# Patient Record
Sex: Female | Born: 1990
Health system: Southern US, Community
[De-identification: ages and names within clinical notes are randomized; demographics above are authoritative.]

## PROBLEM LIST (undated history)

## (undated) DIAGNOSIS — J45909 Unspecified asthma, uncomplicated: Secondary | ICD-10-CM

## (undated) DIAGNOSIS — E781 Pure hyperglyceridemia: Secondary | ICD-10-CM

## (undated) DIAGNOSIS — G43909 Migraine, unspecified, not intractable, without status migrainosus: Secondary | ICD-10-CM

## (undated) DIAGNOSIS — E88819 Insulin resistance, unspecified: Secondary | ICD-10-CM

## (undated) DIAGNOSIS — R7989 Other specified abnormal findings of blood chemistry: Secondary | ICD-10-CM

## (undated) DIAGNOSIS — E8881 Metabolic syndrome: Secondary | ICD-10-CM

## (undated) DIAGNOSIS — F319 Bipolar disorder, unspecified: Secondary | ICD-10-CM

## (undated) DIAGNOSIS — N946 Dysmenorrhea, unspecified: Secondary | ICD-10-CM

## (undated) HISTORY — PX: TYMPANOMASTOIDECTOMY WITH RECONSTRUCTION: SHX5679

## (undated) HISTORY — DX: Pure hyperglyceridemia: E78.1

## (undated) HISTORY — DX: Other specified abnormal findings of blood chemistry: R79.89

## (undated) HISTORY — DX: Metabolic syndrome: E88.81

## (undated) HISTORY — PX: WISDOM TOOTH EXTRACTION: SHX21

## (undated) HISTORY — DX: Dysmenorrhea, unspecified: N94.6

## (undated) HISTORY — DX: Migraine, unspecified, not intractable, without status migrainosus: G43.909

## (undated) HISTORY — DX: Bipolar disorder, unspecified: F31.9

## (undated) HISTORY — DX: Insulin resistance, unspecified: E88.819

---
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
## 2010-02-01 ENCOUNTER — Inpatient Hospital Stay (HOSPITAL_COMMUNITY): Admission: AD | Admit: 2010-02-01 | Discharge: 2010-02-01 | Payer: Self-pay | Admitting: Obstetrics and Gynecology

## 2010-04-09 ENCOUNTER — Inpatient Hospital Stay (HOSPITAL_COMMUNITY): Admission: AD | Admit: 2010-04-09 | Discharge: 2010-04-09 | Payer: Self-pay | Admitting: Obstetrics and Gynecology

## 2010-05-17 ENCOUNTER — Inpatient Hospital Stay (HOSPITAL_COMMUNITY): Admission: AD | Admit: 2010-05-17 | Discharge: 2010-05-19 | Payer: Self-pay | Admitting: Obstetrics and Gynecology

## 2010-05-19 ENCOUNTER — Encounter: Payer: Self-pay | Admitting: Obstetrics and Gynecology

## 2010-06-26 ENCOUNTER — Inpatient Hospital Stay (HOSPITAL_COMMUNITY)
Admission: AD | Admit: 2010-06-26 | Discharge: 2010-06-26 | Payer: Self-pay | Source: Home / Self Care | Admitting: Obstetrics and Gynecology

## 2010-07-17 ENCOUNTER — Inpatient Hospital Stay (HOSPITAL_COMMUNITY)
Admission: AD | Admit: 2010-07-17 | Discharge: 2010-07-20 | Payer: Self-pay | Source: Home / Self Care | Attending: Obstetrics and Gynecology | Admitting: Obstetrics and Gynecology

## 2010-07-18 ENCOUNTER — Encounter (INDEPENDENT_AMBULATORY_CARE_PROVIDER_SITE_OTHER): Payer: Self-pay | Admitting: Obstetrics and Gynecology

## 2010-10-02 LAB — CBC
Platelets: 210 10*3/uL (ref 150–400)
Platelets: 243 10*3/uL (ref 150–400)
RDW: 13.5 % (ref 11.5–15.5)
RDW: 13.6 % (ref 11.5–15.5)
WBC: 10.5 10*3/uL (ref 4.0–10.5)
WBC: 14.4 10*3/uL — ABNORMAL HIGH (ref 4.0–10.5)

## 2010-10-02 LAB — MRSA PCR SCREENING: MRSA by PCR: NEGATIVE

## 2010-10-04 LAB — COMPREHENSIVE METABOLIC PANEL
AST: 14 U/L (ref 0–37)
Alkaline Phosphatase: 78 U/L (ref 39–117)
CO2: 24 mEq/L (ref 19–32)
Chloride: 104 mEq/L (ref 96–112)
Creatinine, Ser: 0.54 mg/dL (ref 0.4–1.2)
GFR calc Af Amer: >60 mL/min (ref >60)
GFR calc non Af Amer: >60 mL/min (ref >60)
Potassium: 3.4 mEq/L — ABNORMAL LOW (ref 3.5–5.1)
Total Bilirubin: 0.3 mg/dL (ref 0.3–1.2)

## 2010-10-04 LAB — MRSA PCR SCREENING: MRSA by PCR: NEGATIVE

## 2010-10-04 LAB — URINE CULTURE
Culture  Setup Time: 201110270214
Special Requests: NEGATIVE

## 2010-10-04 LAB — CBC
MCH: 31.3 pg (ref 26.0–34.0)
Platelets: 213 10*3/uL (ref 150–400)
RBC: 3.4 MIL/uL — ABNORMAL LOW (ref 3.87–5.11)
RDW: 13.1 % (ref 11.5–15.5)
WBC: 12.7 10*3/uL — ABNORMAL HIGH (ref 4.0–10.5)

## 2010-10-05 LAB — URINE MICROSCOPIC-ADD ON

## 2010-10-05 LAB — URINALYSIS, ROUTINE W REFLEX MICROSCOPIC
Glucose, UA: NEGATIVE mg/dL
Specific Gravity, Urine: 1.015 (ref 1.005–1.030)

## 2010-10-08 LAB — URINALYSIS, ROUTINE W REFLEX MICROSCOPIC
Bilirubin Urine: NEGATIVE
Ketones, ur: NEGATIVE mg/dL
Nitrite: NEGATIVE
Protein, ur: NEGATIVE mg/dL
Urobilinogen, UA: 0.2 mg/dL (ref 0.0–1.0)

## 2010-10-08 LAB — URINE CULTURE: Colony Count: >=100000

## 2010-10-08 LAB — WET PREP, GENITAL

## 2010-10-08 LAB — GC/CHLAMYDIA PROBE AMP, GENITAL
Chlamydia, DNA Probe: NEGATIVE
GC Probe Amp, Genital: NEGATIVE

## 2010-10-08 LAB — URINE MICROSCOPIC-ADD ON

## 2010-10-30 LAB — URINALYSIS, ROUTINE W REFLEX MICROSCOPIC
Bilirubin Urine: NEGATIVE
Hgb urine dipstick: NEGATIVE
Nitrite: NEGATIVE
Protein, ur: NEGATIVE mg/dL
Specific Gravity, Urine: 1.016 (ref 1.005–1.030)
Urobilinogen, UA: 0.2 mg/dL (ref 0.0–1.0)

## 2010-10-30 LAB — WET PREP, GENITAL: Trich, Wet Prep: NONE SEEN

## 2010-10-30 LAB — URINE MICROSCOPIC-ADD ON

## 2010-10-30 LAB — POCT PREGNANCY, URINE: Preg Test, Ur: NEGATIVE

## 2010-12-05 NOTE — Discharge Summary (Signed)
Michelle [name], Michelle [name]                ACCOUNT NO.:  [id number]   MEDICAL RECORD NO.:  [id number]          PATIENT TYPE:  INP   LOCATION:  6119                         FACILITY:  [facility]   PHYSICIAN:  Pediatric Teaching ServiceDATE OF BIRTH:  1990-10-12   DATE OF ADMISSION:  12/18/2006  DATE OF DISCHARGE:  12/20/2006                               DISCHARGE SUMMARY   REASON FOR HOSPITALIZATION:  A 20 year old with high-risk sexual  behavior and strong family history of kidney stones with acute-onset  left flank pain and fever.   SIGNIFICANT FINDINGS:  On physical exam, patient with significant left  CVA tenderness along with CMT and purulent vaginal discharge.  Febrile  to 103.  C-Met within normal limits.  CBC with a white blood cell count  of 11.3, hemoglobin 12.6 and platelets 274, amylase 34, lipase 21.  U  pregnancy negative.  Urinalysis that was an I&O cath showed trace  leukocyte esterase with 3-6 white blood cells and a culture that was  positive for proteus.  Wet prep with clue cells, but otherwise was  negative.  GC and Chlamydia PCR were negative.  Abdominal and pelvic CT  showed an atrophic right kidney with compensatory left kidney  hypertrophy and small amount of free pelvic fluid.   TREATMENT:  Ceftriaxone x2 doses IV along with doxycycline 100 mg IV  b.i.d. that was transitioned to p.o. without difficulty, also given  Flagyl 2 gm p.o. IV and morphine and Percocet for pain.  Smoking  cessation consult.  Zofran for nausea and vomiting.  Patient was much  improved on physical exam with only mild pain and no dysuria on day of  discharge.   FINAL DIAGNOSIS:  Left flank pain, likely secondary to a passed kidney  stone with pyelonephritis +/- pelvic inflammatory disease.   DISCHARGE MEDICATIONS:  1. Doxycycline 100 mg p.o. b.i.d. x7 days.  2. Suprax 400 mg p.o. daily x7 days.  3. Percocet one to two tablets p.o. q.6 hour p.r.n., dispensed 20.   Patient encouraged to stop all  alcohol, tobacco and drugs and to use  condoms with each sexual encounter.  We also recommended that she try a  different oral contraceptive pill given her smoking and her weight.   [facility] at 9[facility]00 a.m. and Pediatric Urology  on June 3 at 2:45 p.m.   DISCHARGE WEIGHT:  95 kilos.   DISCHARGE CONDITION:  Improved and good.   A copy of the handwritten discharge summary was faxed to both [facility]  Urology and [facility].     ______________________________  Lupita Raider, M.D.    ______________________________  Pediatric Teaching Service    KS/MEDQ  D:  12/20/2006  T:  12/20/2006  Job:  [id number]

## 2011-05-04 LAB — WET PREP, GENITAL: Yeast Wet Prep HPF POC: NONE SEEN

## 2011-05-04 LAB — URINALYSIS, ROUTINE W REFLEX MICROSCOPIC
Bilirubin Urine: NEGATIVE
Glucose, UA: NEGATIVE
Ketones, ur: 15 — AB
Protein, ur: NEGATIVE
pH: 5.5

## 2011-05-04 LAB — POCT PREGNANCY, URINE
Operator id: 28833
Preg Test, Ur: NEGATIVE

## 2011-05-04 LAB — URINE CULTURE: Colony Count: >=100000

## 2012-02-07 ENCOUNTER — Ambulatory Visit (INDEPENDENT_AMBULATORY_CARE_PROVIDER_SITE_OTHER): Payer: Medicaid Other | Admitting: Obstetrics and Gynecology

## 2012-02-07 ENCOUNTER — Encounter: Payer: Self-pay | Admitting: Obstetrics and Gynecology

## 2012-02-07 VITALS — BP 110/70 | Ht 63.0 in | Wt 213.0 lb

## 2012-02-07 DIAGNOSIS — N898 Other specified noninflammatory disorders of vagina: Secondary | ICD-10-CM

## 2012-02-07 DIAGNOSIS — N926 Irregular menstruation, unspecified: Secondary | ICD-10-CM

## 2012-02-07 DIAGNOSIS — Z0000 Encounter for general adult medical examination without abnormal findings: Secondary | ICD-10-CM

## 2012-02-07 DIAGNOSIS — Z139 Encounter for screening, unspecified: Secondary | ICD-10-CM

## 2012-02-07 DIAGNOSIS — Z113 Encounter for screening for infections with a predominantly sexual mode of transmission: Secondary | ICD-10-CM

## 2012-02-07 DIAGNOSIS — A599 Trichomoniasis, unspecified: Secondary | ICD-10-CM

## 2012-02-07 DIAGNOSIS — Z124 Encounter for screening for malignant neoplasm of cervix: Secondary | ICD-10-CM

## 2012-02-07 LAB — POCT WET PREP (WET MOUNT)
Bacteria Wet Prep HPF POC: NEGATIVE
Clue Cells Wet Prep Whiff POC: NEGATIVE
pH: 5

## 2012-02-07 LAB — CBC
MCH: 29.3 pg (ref 26.0–34.0)
MCV: 83.1 fL (ref 78.0–100.0)
Platelets: 337 10*3/uL (ref 150–400)
RDW: 13.7 % (ref 11.5–15.5)
WBC: 12.9 10*3/uL — ABNORMAL HIGH (ref 4.0–10.5)

## 2012-02-07 LAB — HIV ANTIBODY (ROUTINE TESTING W REFLEX): HIV: NONREACTIVE

## 2012-02-07 LAB — HEPATITIS B SURFACE ANTIGEN: Hepatitis B Surface Ag: NEGATIVE

## 2012-02-07 LAB — TSH: TSH: 3.864 u[IU]/mL (ref 0.350–4.500)

## 2012-02-07 LAB — RPR

## 2012-02-07 LAB — HEPATITIS C ANTIBODY: HCV Ab: NEGATIVE

## 2012-02-07 MED ORDER — METRONIDAZOLE 500 MG PO TABS: 500.0000 mg | ORAL_TABLET | Freq: Two times a day (BID) | ORAL | Status: AC

## 2012-02-07 NOTE — Progress Notes (Signed)
Contraception None Last pap 09/06/2011 WNL Last Mammo None Last Colonoscopy None Last Dexa Scan None Primary MD [facility] at [facility] None  Pt desires STD testing and pap test. C/o irregular cycle last couple of months  Filed Vitals:   02/07/12 1117  BP: 110/70   ROS: noncontributory  Physical Examination: General appearance - alert, well appearing, and in no distress Neck - supple, no significant adenopathy Chest - clear to auscultation, no wheezes, rales or rhonchi, symmetric air entry Heart - normal rate and regular rhythm Abdomen - soft, nontender, nondistended, no masses or organomegaly Breasts - breasts appear normal, no suspicious masses, no skin or nipple changes or axillary nodes Pelvic - normal external genitalia, vulva, vagina, cervix, uterus and adnexa Back exam - no CVAT Extremities - no edema, redness or tenderness in the calves or thighs  A/P Wet Prep - trich - flagyl Pap today STD w/u with consent Pt wants implanon If irregular cycle continues after of implanon - schedule u/s

## 2012-02-08 LAB — VITAMIN D 25 HYDROXY (VIT D DEFICIENCY, FRACTURES): Vit D, 25-Hydroxy: 30 ng/mL (ref 30–89)

## 2012-02-08 LAB — HSV 1 ANTIBODY, IGG: HSV 1 Glycoprotein G Ab, IgG: 0.99 IV — ABNORMAL HIGH

## 2012-02-08 LAB — HSV 2 ANTIBODY, IGG: HSV 2 Glycoprotein G Ab, IgG: <0.1 IV

## 2012-02-11 LAB — PAP IG, CT-NG, RFX HPV ASCU
Chlamydia Probe Amp: NEGATIVE
GC Probe Amp: NEGATIVE

## 2012-02-14 ENCOUNTER — Encounter: Payer: Self-pay | Admitting: Obstetrics and Gynecology

## 2012-03-06 ENCOUNTER — Encounter (INDEPENDENT_AMBULATORY_CARE_PROVIDER_SITE_OTHER): Payer: Medicaid Other | Admitting: Obstetrics and Gynecology

## 2012-03-06 ENCOUNTER — Encounter: Payer: Medicaid Other | Admitting: Obstetrics and Gynecology

## 2012-04-09 ENCOUNTER — Other Ambulatory Visit: Payer: Self-pay | Admitting: Dermatology

## 2012-06-05 NOTE — Progress Notes (Signed)
error 

## 2013-03-06 ENCOUNTER — Emergency Department (HOSPITAL_COMMUNITY)
Admission: EM | Admit: 2013-03-06 | Discharge: 2013-03-06 | Disposition: A | Discharge status: Home / Self Care | Payer: Medicaid Other | Attending: Emergency Medicine | Admitting: Emergency Medicine

## 2013-03-06 ENCOUNTER — Encounter (HOSPITAL_COMMUNITY): Payer: Self-pay | Admitting: Emergency Medicine

## 2013-03-06 DIAGNOSIS — Z8639 Personal history of other endocrine, nutritional and metabolic disease: Secondary | ICD-10-CM | POA: Insufficient documentation

## 2013-03-06 DIAGNOSIS — Z862 Personal history of diseases of the blood and blood-forming organs and certain disorders involving the immune mechanism: Secondary | ICD-10-CM | POA: Insufficient documentation

## 2013-03-06 DIAGNOSIS — J45909 Unspecified asthma, uncomplicated: Secondary | ICD-10-CM | POA: Insufficient documentation

## 2013-03-06 DIAGNOSIS — K047 Periapical abscess without sinus: Secondary | ICD-10-CM | POA: Insufficient documentation

## 2013-03-06 DIAGNOSIS — Z8742 Personal history of other diseases of the female genital tract: Secondary | ICD-10-CM | POA: Insufficient documentation

## 2013-03-06 DIAGNOSIS — F17200 Nicotine dependence, unspecified, uncomplicated: Secondary | ICD-10-CM | POA: Insufficient documentation

## 2013-03-06 HISTORY — DX: Unspecified asthma, uncomplicated: J45.909

## 2013-03-06 MED ORDER — AMOXICILLIN 500 MG PO CAPS: 500.0000 mg | ORAL_CAPSULE | Freq: Three times a day (TID) | ORAL | Status: DC

## 2013-03-06 MED ORDER — IBUPROFEN 600 MG PO TABS
600.0000 mg | ORAL_TABLET | Freq: Four times a day (QID) | ORAL | Status: DC | PRN
Start: 2013-03-06 — End: 2016-03-12

## 2013-03-06 MED ORDER — HYDROCODONE-ACETAMINOPHEN 5-325 MG PO TABS: 1.0000 | ORAL_TABLET | Freq: Four times a day (QID) | ORAL | Status: DC | PRN

## 2013-03-06 MED ORDER — OXYCODONE-ACETAMINOPHEN 5-325 MG PO TABS
1.0000 | ORAL_TABLET | Freq: Once | ORAL | Status: AC
  Administered 2013-03-06: 1 via ORAL
  Filled 2013-03-06: qty 1

## 2013-03-06 NOTE — ED Provider Notes (Signed)
  CSN: [id number]     Arrival date & time 03/06/13  0555 History     First MD Initiated Contact with Patient 03/06/13 0559     Chief Complaint  Patient presents with  . Dental Pain   (Consider location/radiation/quality/duration/timing/severity/associated sxs/prior Treatment) HPI Michelle [name] is a 22 y.o. female who presents to ED with complaint of swelling and pain in her front lower teeth. States started 2 days ago, worsened today. States painful to chew and to press on the gum. States feels a "knot" on the inside of the gum. States was unable to get a dental apt. Denies fever, chills, malaise. No swelling under the tongue. No difficulty opening mouth.    Past Medical History  Diagnosis Date  . Insulin resistance   . Elevated testosterone level in female   . Hypertriglyceridemia   . Dysmenorrhea   . Asthma    Past Surgical History  Procedure Laterality Date  . Wisdom tooth extraction     No family history on file. History  Substance Use Topics  . Smoking status: Current Every Day Smoker    Types: Cigarettes  . Smokeless tobacco: Not on file  . Alcohol Use: No   OB History   Grav Para Term Preterm Abortions TAB SAB Ect Mult Living   2 1             Review of Systems  Constitutional: Negative for fever and chills.  HENT: Positive for dental problem.   Respiratory: Negative.   Cardiovascular: Negative.   Neurological: Negative for headaches.    Allergies  Review of patient's allergies indicates no known allergies.  Home Medications   Current Outpatient Rx  Name  Route  Sig  Dispense  Refill  . phentermine 37.5 MG capsule   Oral   Take 37.5 mg by mouth every morning.          BP 141/87  Pulse 90  Temp(Src) 97.6 F (36.4 C) (Oral)  Resp 16  SpO2 98%  LMP 03/06/2013 Physical Exam  Nursing note and vitals reviewed. Constitutional: She appears well-developed and well-nourished. No distress.  HENT:  Poor dentition with multiple carries. There is no  obvious dental decay in the lower central incisors. There is mild gum swelling and tenderness around the teeth. NO trismus. No swelling under the tongue  Eyes: Conjunctivae are normal.  Neck: Neck supple.  Neurological: She is alert.  Skin: Skin is warm and dry.    ED Course   Procedures (including critical care time)  Labs Reviewed - No data to display No results found. 1. Dental abscess     MDM  Pt with early dental abscess. No draiable area at this time. Home with amoxicillin. norco for pain. Follow up with a dentist.   Filed Vitals:   03/06/13 0559  BP: 141/87  Pulse: 90  Temp: 97.6 F (36.4 C)  TempSrc: Oral  Resp: 16  SpO2: 98%     Lottie Mussel, PA-C 03/06/13 [phone]

## 2013-03-06 NOTE — ED Notes (Signed)
Per PT, pt has had a sore behind her bottom front teeth/under her tongue that has increased in swelling in the past three days, pt first noticed the sore three days ago. Pt states she is in a lot of pain and that she is not able to sleep. Pt states her bottom front teeth now have a gap between them that was not there three days ago also. Pt denies any injury to the mouth. Pt denies any HA or any blurred vision/dizziness.

## 2013-03-06 NOTE — ED Provider Notes (Signed)
Medical screening examination/treatment/procedure(s) were performed by non-physician practitioner and as supervising physician I was immediately available for consultation/collaboration.   Glynn Octave, MD 03/06/13 0630

## 2014-05-24 ENCOUNTER — Encounter (HOSPITAL_COMMUNITY): Payer: Self-pay | Admitting: Emergency Medicine

## 2016-03-12 ENCOUNTER — Encounter: Payer: Self-pay | Admitting: Primary Care

## 2016-03-12 ENCOUNTER — Ambulatory Visit (INDEPENDENT_AMBULATORY_CARE_PROVIDER_SITE_OTHER): Payer: 59 | Admitting: Primary Care

## 2016-03-12 VITALS — BP 134/84 | HR 98 | Temp 98.1°F | Ht 63.0 in | Wt 233.8 lb

## 2016-03-12 DIAGNOSIS — Q639 Congenital malformation of kidney, unspecified: Secondary | ICD-10-CM

## 2016-03-12 DIAGNOSIS — R635 Abnormal weight gain: Secondary | ICD-10-CM | POA: Diagnosis not present

## 2016-03-12 DIAGNOSIS — H7290 Unspecified perforation of tympanic membrane, unspecified ear: Secondary | ICD-10-CM

## 2016-03-12 DIAGNOSIS — G43909 Migraine, unspecified, not intractable, without status migrainosus: Secondary | ICD-10-CM | POA: Insufficient documentation

## 2016-03-12 DIAGNOSIS — F3162 Bipolar disorder, current episode mixed, moderate: Secondary | ICD-10-CM | POA: Insufficient documentation

## 2016-03-12 DIAGNOSIS — E66813 Obesity, class 3: Secondary | ICD-10-CM | POA: Insufficient documentation

## 2016-03-12 DIAGNOSIS — Z6841 Body Mass Index (BMI) 40.0 and over, adult: Secondary | ICD-10-CM

## 2016-03-12 DIAGNOSIS — G43901 Migraine, unspecified, not intractable, with status migrainosus: Secondary | ICD-10-CM

## 2016-03-12 DIAGNOSIS — H7291 Unspecified perforation of tympanic membrane, right ear: Secondary | ICD-10-CM | POA: Diagnosis not present

## 2016-03-12 HISTORY — DX: Unspecified perforation of tympanic membrane, unspecified ear: H72.90

## 2016-03-12 MED ORDER — AMITRIPTYLINE HCL 25 MG PO TABS
25.0000 mg | ORAL_TABLET | Freq: Every day | ORAL | 2 refills | Status: DC
Start: 2016-03-12 — End: 2016-10-30

## 2016-03-12 MED ORDER — AMOXICILLIN-POT CLAVULANATE 875-125 MG PO TABS: 1.0000 | ORAL_TABLET | Freq: Two times a day (BID) | ORAL | 0 refills | Status: DC

## 2016-03-12 NOTE — Assessment & Plan Note (Signed)
Left kidney half the size of right. Endorses normal Ink testing in the past. Denies decreased renal function. Check CMP today. Will continue to monitor.

## 2016-03-12 NOTE — Assessment & Plan Note (Signed)
Manic depressive. Overall stable. Uses marijuana for treatment. Referral placed to psychiatry for further evaluation. Started Amitriptyline HS for headaches and insomnia. Will follow up with patient in 4 weeks. Denies SI/HI.

## 2016-03-12 NOTE — Assessment & Plan Note (Signed)
Chronic headaches and migraines since age 89. Headaches nearly daily, migraines every 2 weeks. Discussed various treatment options and believe Amitriptyline will be best option given history of bipolar disorder and insomnia. Start Amitriptyline 25 mg tablets HS for prevention and insomnia. Will obtain update in 1 month, follow up in 3 months for re-eval.

## 2016-03-12 NOTE — Patient Instructions (Addendum)
Start Augmentin antibiotics for ear infection. Take 1 tablet by mouth twice daily for 10 days. Please notify me if you continue to notice drainage (on pillow case) in 5-7 days. Do not use q-tips in your ear.   Start Amitriptyline 25 mg tablets for frequent headaches/migraines and sleep. Take 1 tablet by mouth every night at bedtime.  You will be contacted regarding your referral to Psychiatry.  Please let us know if you have not heard back within one week.   Complete lab work prior to leaving today. I will notify you of your results once received.   Follow up in 3 months for re-evaluation.  It was a pleasure to see you today!   Tympanic Membrane Perforation The eardrum (tympanic membrane) protects the inner ear from the outside environment. In addition to protection, the eardrum allows you to hear by transmitting sound waves to the bones in your ear and then to the nervous system. The tympanic membrane is easily perforated, which may result in damage to the inner ear. SYMPTOMS   Sometimes there are no symptoms.  Decreased hearing.  Fluid drainage from ear.  Ear pain. CAUSES   Most commonly, a middle ear infection from built-up pressure.  Injury from a cotton swab.  Traumatic injury to the side of the head. RISK INCREASES WITH:  Frequent middle ear infections.  Use of cotton swabs. PREVENTION   Do not use cotton swabs to clean the ear canal.  If you have ear pain or pressure, see your caregiver to rule out an ear infection that needs treatment. TREATMENT  Protecting the inner ear and allowing the membrane to heal on its own is how tympanic membrane rupture is usually treated. Healing may take several weeks. In order to protect the inner ear, do not allow any fluid to enter the ear canal. Avoid being submerged in water. The use of ear drops may prevent an ear infection from developing, but they should be used with caution, as ear drops can also cause damage to the inner ear.  It is important to follow up with your caregiver to confirm healing of the tympanic membrane. If the membrane does not heal, permanent hearing loss may occur. To avoid serious complications, tympanic membranes that do not heal on their own are repaired with surgery.   This information is not intended to replace advice given to you by your health care provider. Make sure you discuss any questions you have with your health care provider.   Document Released: 07/09/2005 Document Revised: 10/01/2011 Document Reviewed: 01/19/2015 Elsevier Interactive Patient Education [employer]2016 Elsevier Inc.

## 2016-03-12 NOTE — Progress Notes (Signed)
Pre visit review using our clinic review tool, if applicable. No additional management support is needed unless otherwise documented below in the visit note. 

## 2016-03-12 NOTE — Assessment & Plan Note (Signed)
Ear pain/pressure, decreased hearing, ringing to right ear x 4 weeks. Exam today with evidence of rupture with presence of puss. Treat with Augmentin course and other conservative treatment. Will have her follow up for re-evaluation if no improvement in symptoms.

## 2016-03-12 NOTE — Progress Notes (Signed)
Subjective:    Patient ID: Alexander MtKatie M Axelrod, female    DOB: 04/20/1991, 25 y.o.   MRN: [id number]  HPI  Ms. Lorek is a 25 year old female who presents today to establish care and discuss the problems mentioned below. Will obtain old records.  1) Bipolar Disorder (Manic Depressive): Diagnosed in 2014. She was once managed on Latuda for which she stopped taking in 2015 due to side effects. She does not currently see psychiatry as her prior psychiatrist had left the practice. She will experience irritability, anger outbursts, difficulty sleeping, mind racing thoughts. She will self medicate with marijuana with improvement but she is wanting to try Rx medications so she won't have to revert to drug abuse. Denies SI/HI.  2) Ear Pain: Mostly pressure and located to her right ear. History of migraines. She's noticed increased intensity in her headaches. She also reports intermittent cough and decreased hearing. Her pain has been present for the past 4 weeks. She also reports ringing in her ears. Denies fevers, fatigue. She's used OTC ear drops, rubbing alcohol, and Rx strength ear drops from her daughters old prescription without improvement.   3) Migraines: Diagnosed at age 25. She was previously managed on abortive treatment in her teenage years but cannot remember the name. She's undergone evaluation in the past for her headaches which were inconclusive regarding cause.   She will experience migraines once every 2 weeks. She will experience photophobia, phonophobia, occasional nausea during migraines. Headaches occur every 2-3 days. Her headaches/migrianes are located to the right parietal and temporal lobe, and behind both eyes. She describes her pain as a throbbing sensation. She will take ibuprofen and Excedrin migraine with improvement, but takes these medications nearly everyday.   4) Renal Abnormality: Was told that her left kidney is less than half the size of her right. Bouts of kidney pain that  will cause her to have severe pain requiring hospital visits and admission. She underwent ink testing in the past which was negative. She denies decreased renal function. She has been taking ibuprofen consistently for years.   5) Obesity: Long history of. Once managed on Phentermine with improvement as she lost 20 pounds. She regained the weight once she came off of the medication. She is overeating now and endorses a poor diet as she doesn't have time to cook healthier. She does not currently exercise as she is very tired when coming home from work. She feels she has little energy. History of "borderline thyroid dysfunction" in the past, strong FH of thyroid disorder.  Her diet currently consists of: Breakfast: Skips Lunch: Fruit, crackers Dinner: Cereal, frozen dinner, chicken nuggets, vegetables, chicken Snacks: Candy, lunchable, yogurt Desserts: Once weekly Beverages: Milk (1 cup every 2 days), sweet tea, water  Exercise: She does not currently exercise.   Review of Systems  Constitutional: Negative for fever.  HENT: Positive for ear discharge and ear pain. Negative for congestion and sinus pressure.   Respiratory: Positive for cough. Negative for shortness of breath.   Cardiovascular: Negative for chest pain.  Genitourinary: Negative for menstrual problem.  Musculoskeletal: Negative for myalgias.  Skin: Negative for color change.  Allergic/Immunologic: Positive for environmental allergies.  Neurological: Positive for headaches. Negative for dizziness and numbness.  Psychiatric/Behavioral: Positive for agitation and sleep disturbance. Negative for suicidal ideas. The patient is nervous/anxious.        Past Medical History:  Diagnosis Date  . Asthma   . Bipolar disorder (manic depression) (HCC)   . Dysmenorrhea   .  Elevated testosterone level in female   . Hypertriglyceridemia   . Insulin resistance   . Migraines      Social History   Social History  . Marital status:  Single    Spouse name: N/A  . Number of children: N/A  . Years of education: N/A   Occupational History  . Not on file.   Social History Main Topics  . Smoking status: Current Every Day Smoker    Packs/day: 1.00    Types: Cigarettes  . Smokeless tobacco: Never Used  . Alcohol use No  . Drug use: No  . Sexual activity: Yes    Birth control/ protection: None   Other Topics Concern  . Not on file   Social History Narrative   Single.   1 child.   Works as a Emergency planning/management officerroject Manager.   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        Past Surgical History:  Procedure Laterality Date  . WISDOM TOOTH EXTRACTION      Family History  Problem Relation Age of Onset  . Gestational diabetes Mother   . Hyperlipidemia Maternal Grandfather     No Known Allergies  No current outpatient prescriptions on file prior to visit.   No current facility-administered medications on file prior to visit.     BP 134/84   Pulse 98   Temp 98.1 F (36.7 C) (Oral)   Ht 5\' 3"  (1.6 m)   Wt 233 lb 12.8 oz (106.1 kg)   LMP 03/04/2016   SpO2 98%   BMI 41.42 kg/m    Objective:   Physical Exam  Constitutional: She appears well-nourished.  HENT:  Right Ear: Ear canal normal. Tympanic membrane is perforated. Tympanic membrane is not erythematous.  Left Ear: Ear canal normal. Tympanic membrane is bulging. Tympanic membrane is not perforated and not erythematous.  Nose: Right sinus exhibits no maxillary sinus tenderness and no frontal sinus tenderness. Left sinus exhibits no maxillary sinus tenderness and no frontal sinus tenderness.  Mouth/Throat: Oropharynx is clear and moist.  Eyes: Conjunctivae and EOM are normal.  Neck: Neck supple.  Cardiovascular: Normal rate and regular rhythm.   Pulmonary/Chest: Effort normal and breath sounds normal. She has no wheezes. She has no rales.  Lymphadenopathy:    She has no cervical adenopathy.  Neurological: No cranial nerve deficit.  Skin: Skin is warm and dry.    Psychiatric: She has a normal mood and affect.          Assessment & Plan:  >45 minutes spent face to face with patient, >50% spent counseling or coordinating care.

## 2016-03-12 NOTE — Assessment & Plan Note (Signed)
Poor diet, does not exercise. Family history of thyroid disorder. Will check TSH and Free T4 to rule out metabolic cause. Also check B 12 given fatigue. Handouts and education provided regarding healthy diet. Discussed use of Apps such as My Fitness Pal. Discussed that I do not prescribe weight loss medication, she verbalized understanding.

## 2016-03-13 ENCOUNTER — Telehealth: Payer: Self-pay | Admitting: Primary Care

## 2016-03-13 LAB — COMPREHENSIVE METABOLIC PANEL
ALK PHOS: 72 U/L (ref 39–117)
ALT: 21 U/L (ref 0–35)
AST: 18 U/L (ref 0–37)
Albumin: 4.4 g/dL (ref 3.5–5.2)
BILIRUBIN TOTAL: 0.3 mg/dL (ref 0.2–1.2)
BUN: 10 mg/dL (ref 6–23)
CO2: 26 mEq/L (ref 19–32)
CREATININE: 1.04 mg/dL (ref 0.40–1.20)
Calcium: 9.2 mg/dL (ref 8.4–10.5)
Chloride: 105 mEq/L (ref 96–112)
GFR: 68.5 mL/min (ref >60.00)
GLUCOSE: 72 mg/dL (ref 70–99)
Potassium: 4.1 mEq/L (ref 3.5–5.1)
SODIUM: 140 meq/L (ref 135–145)
TOTAL PROTEIN: 7.5 g/dL (ref 6.0–8.3)

## 2016-03-13 LAB — VITAMIN B12: Vitamin B-12: 454 pg/mL (ref 211–911)

## 2016-03-13 LAB — TSH: TSH: 2.48 u[IU]/mL (ref 0.35–4.50)

## 2016-03-13 LAB — T4, FREE: Free T4: 0.81 ng/dL (ref 0.60–1.60)

## 2016-03-13 NOTE — Telephone Encounter (Signed)
Pt aware [facility] will call to schedule with psychiatrist. Placed pt on WQ and also sent faxed. Pt has all of their information

## 2016-03-15 ENCOUNTER — Telehealth: Payer: Self-pay | Admitting: Primary Care

## 2016-03-15 NOTE — Telephone Encounter (Signed)
Patient Name: Michelle [name] Weihe  DOB: 04/18/1991    Initial Comment She was put on an abx for her ear ache and then she started having a fever. Throat has been sore, headache   Nurse Assessment  Nurse: Stefano GaulStringer, RN, Dwana CurdVera Date/Time Lamount Cohen(Eastern Time): 03/15/2016 11:38:04 AM  Confirm and document reason for call. If symptomatic, describe symptoms. You must click the next button to save text entered. ---Caller states she is taking augmentin for ear infection that she started on Tuesday. Has had diarrhea. Has rash all over her body. Has had headache and a sore throat. Has orange urine. Has had low grade fever. Rash consists of little red bumps. Not itchy.  Has the patient traveled out of the country within the last 30 days? ---Not Applicable  Does the patient have any new or worsening symptoms? ---Yes  Will a triage be completed? ---Yes  Related visit to physician within the last 2 weeks? ---Yes  Does the PT have any chronic conditions? (i.e. diabetes, asthma, etc.) ---Yes  List chronic conditions. ---asthma  Is the patient pregnant or possibly pregnant? (Ask all females between the ages of 7612-55) ---No  Is this a behavioral health or substance abuse call? ---No     Guidelines    Guideline Title Affirmed Question Affirmed Notes  Rash - Widespread On Drugs Face or lip swelling    Final Disposition User   See Physician within 4 Hours (or PCP triage) Stefano GaulStringer, RN, Vera    Comments  Pt states she can not come in for appt today as she is at work. Please call pt back regarding whether she needs to continue her augmentin or if a new antibiotic needs to be called in.   Referrals  GO TO FACILITY REFUSED   Disagree/Comply: Disagree  Disagree/Comply Reason: Disagree with instructions

## 2016-03-15 NOTE — Telephone Encounter (Signed)
Tried to call patient 3 times today but could not leave voicemail due to being full.

## 2016-03-15 NOTE — Telephone Encounter (Signed)
Spoken and notified patient of Kate's comments. Patient verbalized understanding. 

## 2016-03-15 NOTE — Telephone Encounter (Signed)
Please have patient stop Augmentin immediately. Have her take Benadryl for rash and itching.  If she experiences shortness of breath, swelling to her throat, increased hives please have her go to the emergency department immediately. Please have her update us in 24 hours. We will initiate another antibiotic but I want to ensure her symptoms have improved before we do this.

## 2016-03-16 ENCOUNTER — Telehealth: Payer: Self-pay | Admitting: Primary Care

## 2016-03-16 DIAGNOSIS — H7291 Unspecified perforation of tympanic membrane, right ear: Secondary | ICD-10-CM

## 2016-03-16 NOTE — Telephone Encounter (Signed)
Tried to call patient but could not leave message for patient due to voice box is full.  

## 2016-03-16 NOTE — Telephone Encounter (Signed)
Tried to call patient but could not leave message for patient due to voice box is full.

## 2016-03-16 NOTE — Telephone Encounter (Signed)
-----   Message from Doreene NestKatherine K Lenny Bouchillon, NP sent at 03/15/2016 12:51 PM EDT ----- Regarding: Allergic Reaction Please check on patient. How are her symptoms off of the Augmentin?

## 2016-03-19 MED ORDER — AZITHROMYCIN 250 MG PO TABS: ORAL_TABLET | ORAL | 0 refills | Status: DC

## 2016-03-19 NOTE — Telephone Encounter (Signed)
Tried to call patient but could not leave message for patient due to voice box is full.  

## 2016-03-19 NOTE — Telephone Encounter (Signed)
Please notify patient that I have sent in a new antibiotic prescription to her pharmacy called azithromycin. Take 2 tablets by mouth today, then 1 tablet daily for 4 additional days.

## 2016-03-19 NOTE — Telephone Encounter (Signed)
Patient called back. She stated that the speaker on her phone is broken.  Patient stated that the hives have cleared up.

## 2016-03-20 ENCOUNTER — Ambulatory Visit: Payer: Medicaid Other | Admitting: Primary Care

## 2016-03-28 ENCOUNTER — Ambulatory Visit: Payer: 59 | Admitting: Psychiatry

## 2016-04-02 ENCOUNTER — Telehealth: Payer: Self-pay | Admitting: Primary Care

## 2016-04-02 NOTE — Telephone Encounter (Signed)
-----   Message from Doreene NestKatherine K Coutney Wildermuth, NP sent at 03/12/2016  4:57 PM EDT ----- Regarding: headaches How's she doing since we initiated Amitriptyline for her headaches? Any improvement?

## 2016-04-03 NOTE — Telephone Encounter (Addendum)
Tried to call patient but could not leave message since mailbox is full.  

## 2016-04-05 NOTE — Telephone Encounter (Signed)
Tried to call patient but could not leave message since mailbox is full.

## 2016-05-07 ENCOUNTER — Ambulatory Visit: Payer: 59 | Admitting: Psychiatry

## 2016-06-05 ENCOUNTER — Encounter: Payer: Self-pay | Admitting: Psychiatry

## 2016-06-05 ENCOUNTER — Ambulatory Visit (INDEPENDENT_AMBULATORY_CARE_PROVIDER_SITE_OTHER): Payer: 59 | Admitting: Psychiatry

## 2016-06-05 VITALS — BP 113/75 | HR 88 | Temp 97.3°F | Resp 16 | Ht 63.0 in | Wt 232.4 lb

## 2016-06-05 DIAGNOSIS — F3130 Bipolar disorder, current episode depressed, mild or moderate severity, unspecified: Secondary | ICD-10-CM | POA: Diagnosis not present

## 2016-06-05 NOTE — Progress Notes (Signed)
Psychiatric Initial Adult Assessment   Patient Identification: Michelle MtKatie M Joaquin MRN:  [id number] Date of Evaluation:  06/05/2016 Referral Source: Vernona RiegerKatherine Clark NP Chief Complaint:   Chief Complaint    Agitation; Manic Behavior; Anxiety; Depression; Drug Problem; Medication Problem     Visit Diagnosis:    ICD-9-CM ICD-10-CM   1. Bipolar I disorder, most recent episode depressed (HCC) 296.50 F31.30     History of Present Illness:  Patient is a 25 year old Caucasian female who was referred by her primary care provider for an assessment of her bipolar disorder and treatment. Patient today reports that she has been feeling very irritable and getting annoyed easily. States that she is unable to control that. States that she works as a Emergency planning/management officerproject manager at a [employer] and things are not done in a Sport and exercise psychologistprofessional manner. States that this really has been annoying her. Reports she did have bipolar episode 5 years ago but this is much better than that time. Currently she is reporting a depressed mood 1 in to sleep all the time and not having much energy or feeling refreshed. She denies any suicidal thoughts. Patient lives with her 25-year-old daughter. States that her mom is supportive but that she had a childhood where mom was addicted to opiates and she had to be the parent to her younger siblings as well. Currently she is in a relationship with a man who also has a child but states his supportive. They do not live together. Patient endorsing racing thoughts, feeling grandiose and at times spending money that she does not have. She also endorses impulsivity and irritability often. Does have difficulty concentrating at times. She denies any excessive worrying. But states that she does conjure up scenarios were something very bad may happen that have happened to others. She has never been hospitalized psychiatrically. She did see a psychiatrist in the past and was started on Lipitor which she reports was  increased up to 80 mg at which point she felt like a zombie. Currently she smokes marijuana on a regular basis and also smokes cigarettes. States that the marijuana was helping her somewhat with her bipolar symptoms but not currently. She denies any psychotic symptoms. Denies any PTSD like symptoms. She denies any suicidal thoughts.    Associated Signs/Symptoms: Depression Symptoms:  depressed mood, anhedonia, psychomotor agitation, fatigue, feelings of worthlessness/guilt, difficulty concentrating, hopelessness, (Hypo) Manic Symptoms:  Distractibility, Irritable Mood, Labiality of Mood, Anxiety Symptoms:  Excessive Worry, Psychotic Symptoms:  denies PTSD Symptoms: denies  Past Psychiatric History:   Previous Psychotropic Medications: Yes   Substance Abuse History in the last 12 months:  Yes.    Consequences of Substance Abuse: Negative  Past Medical History:  Past Medical History:  Diagnosis Date  . Asthma   . Bipolar disorder (manic depression) (HCC)   . Dysmenorrhea   . Elevated testosterone level in female   . Hypertriglyceridemia   . Insulin resistance   . Migraines     Past Surgical History:  Procedure Laterality Date  . WISDOM TOOTH EXTRACTION      Family Psychiatric History: Mom has depression and other family members.  Family History:  Family History  Problem Relation Age of Onset  . Gestational diabetes Mother   . Hyperlipidemia Maternal Grandfather      Social History:  Patient is a single mother and works as a Emergency planning/management officerproject manager at a [employer]. She is in relationship with a man who is 25 years old and also has a child.  Social  History   Social History  . Marital status: Single    Spouse name: N/A  . Number of children: N/A  . Years of education: N/A   Social History Main Topics  . Smoking status: Current Every Day Smoker    Packs/day: 1.00    Types: Cigarettes  . Smokeless tobacco: Never Used  . Alcohol use No  . Drug use: No  .  Sexual activity: Yes    Birth control/ protection: None   Other Topics Concern  . None   Social History Narrative   Single.   1 child.   Works as a Emergency planning/management officerroject Manager.   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        Additional Social History:   Allergies:   Allergies  Allergen Reactions  . Augmentin [Amoxicillin-Pot Clavulanate] Hives    Metabolic Disorder Labs: No results found for: HGBA1C, MPG Lab Results  Component Value Date   PROLACTIN 6.8 02/07/2012   No results found for: CHOL, TRIG, HDL, CHOLHDL, VLDL, LDLCALC   Current Medications: Current Outpatient Prescriptions  Medication Sig Dispense Refill  . albuterol (PROVENTIL) (2.5 MG/3ML) 0.083% nebulizer solution Inhale 2.5 mg into the lungs every 6 (six) hours as needed for wheezing or shortness of breath.    Marland Kitchen. amitriptyline (ELAVIL) 25 MG tablet Take 1 tablet (25 mg total) by mouth at bedtime. 30 tablet 2  . azithromycin (ZITHROMAX) 250 MG tablet Take 2 tablets by mouth today, then 1 tablet daily for 4 additional days. 6 tablet 0   No current facility-administered medications for this visit.     Neurologic: Headache: No Seizure: No Paresthesias:No  Musculoskeletal: Strength & Muscle Tone: within normal limits Gait & Station: normal Patient leans: N/A  Psychiatric Specialty Exam: ROS  Blood pressure 113/75, pulse 88, temperature 97.3 F (36.3 C), temperature source Tympanic, resp. rate 16, height 5\' 3"  (1.6 m), weight 232 lb 6.4 oz (105.4 kg), last menstrual period 04/04/2016, SpO2 97 %.Body mass index is 41.17 kg/m.  General Appearance: Casual  Eye Contact:  Fair  Speech:  Clear and Coherent  Volume:  Increased  Mood:  Anxious and Dysphoric  Affect:  Constricted and Flat  Thought Process:  Coherent  Orientation:  Full (Time, Place, and Person)  Thought Content:  WDL  Suicidal Thoughts:  No  Homicidal Thoughts:  No  Memory:  Immediate;   Fair Recent;   Fair Remote;   Fair  Judgement:  Fair  Insight:   Present  Psychomotor Activity:  Normal  Concentration:  Concentration: Fair and Attention Span: Fair  Recall:  FiservFair  Fund of Knowledge:Fair  Language: Fair  Akathisia:  No  Handed:  Right  AIMS (if indicated):  na  Assets:  Communication Skills Desire for Improvement Financial Resources/Insurance Housing Resilience Social Support Vocational/Educational  ADL's:  Intact  Cognition: WNL  Sleep:  fair    Treatment Plan Summary:  Bipolar disorder current episode depressed  Start Latuda at 20mg  daily. Patient aware of side effects and benefits. Patient recommended to see a therapist, states she cannot afford currently.  Patient given samples for 3 weeks and a copay card for medication to help with the costs.  RTC in 1 month or call before if necessary.   Patrick NorthAVI, Earland Reish, MD 11/14/20179:27 AM

## 2016-06-25 ENCOUNTER — Other Ambulatory Visit: Payer: Self-pay

## 2016-06-25 MED ORDER — LURASIDONE HCL 20 MG PO TABS: 20.0000 mg | ORAL_TABLET | Freq: Every day | ORAL | 0 refills | Status: DC

## 2016-06-25 NOTE — Telephone Encounter (Signed)
called pt states that she [facility]. i will give patient some sample to do until she get her rx filled. lot # [id number] p exp 04-2020 latuda 20mg  x 5pks.  Pt still needs rx sent in.

## 2016-06-25 NOTE — Telephone Encounter (Signed)
pt called left a message on voice mail that she needed a rx sent in for the latuda 20mg  to her pharmacy [facility] [place].  pt states that she is out and that it works well no rx was sent in pt was given samples last time.

## 2016-07-04 ENCOUNTER — Ambulatory Visit: Payer: Self-pay | Admitting: Psychiatry

## 2016-08-14 ENCOUNTER — Ambulatory Visit: Payer: 59 | Admitting: Psychiatry

## 2016-08-15 DIAGNOSIS — N83209 Unspecified ovarian cyst, unspecified side: Secondary | ICD-10-CM | POA: Insufficient documentation

## 2016-10-30 ENCOUNTER — Encounter: Payer: Self-pay | Admitting: Primary Care

## 2016-10-30 ENCOUNTER — Encounter (INDEPENDENT_AMBULATORY_CARE_PROVIDER_SITE_OTHER): Payer: Self-pay

## 2016-10-30 ENCOUNTER — Ambulatory Visit (INDEPENDENT_AMBULATORY_CARE_PROVIDER_SITE_OTHER): Payer: 59 | Admitting: Primary Care

## 2016-10-30 VITALS — BP 118/72 | HR 82 | Temp 98.1°F | Ht 63.0 in | Wt 217.8 lb

## 2016-10-30 DIAGNOSIS — L0292 Furuncle, unspecified: Secondary | ICD-10-CM

## 2016-10-30 NOTE — Patient Instructions (Signed)
Return to the clinic on Friday this week for re-evaluation and re-packing.  Continue to keep the wound covered and dry.  It was a pleasure to see you today!

## 2016-10-30 NOTE — Progress Notes (Signed)
Pre visit review using our clinic review tool, if applicable. No additional management support is needed unless otherwise documented below in the visit note. 

## 2016-10-30 NOTE — Progress Notes (Signed)
   Subjective:    Patient ID: Michelle [name], female    DOB: 03/19/1991, 26 y.o.   MRN: [id number]  HPI  Michelle [name] is a 26 year old female who presents today for Urgent Care follow up.  She presented to [facility] on Saturday this past weekend for boil. She first noticed the boil 8 days ago. Her boil was lanced and packed 3 days ago and was prescribed Doxycycline. Overall she's doing well, she's experiencing bloody/light colored drainage. She is here to have her packing removed.She denies fevers.   Her boils are typically located to the groin and buttocks that are consistent. She does apply warm compresses to her other boils but this never helps. Her last major boil requiring antibiotics was several years ago.   Review of Systems  Constitutional: Negative for fever.  Skin: Positive for wound.       Past Medical History:  Diagnosis Date  . Asthma   . Bipolar disorder (manic depression) (HCC)   . Dysmenorrhea   . Elevated testosterone level in female   . Hypertriglyceridemia   . Insulin resistance   . Migraines      Social History   Social History  . Marital status: Single    Spouse name: N/A  . Number of children: N/A  . Years of education: N/A   Occupational History  . Not on file.   Social History Main Topics  . Smoking status: Current Every Day Smoker    Packs/day: 1.00    Types: Cigarettes  . Smokeless tobacco: Never Used  . Alcohol use No  . Drug use: No  . Sexual activity: Yes    Birth control/ protection: None   Other Topics Concern  . Not on file   Social History Narrative   Single.   1 child.   Works as a Emergency planning/management officer.   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        Past Surgical History:  Procedure Laterality Date  . WISDOM TOOTH EXTRACTION      Family History  Problem Relation Age of Onset  . Gestational diabetes Mother   . Hyperlipidemia Maternal Grandfather     Allergies  Allergen Reactions  . Augmentin [Amoxicillin-Pot  Clavulanate] Hives    Current Outpatient Prescriptions on File Prior to Visit  Medication Sig Dispense Refill  . albuterol (PROVENTIL) (2.5 MG/3ML) 0.083% nebulizer solution Inhale 2.5 mg into the lungs every 6 (six) hours as needed for wheezing or shortness of breath.     No current facility-administered medications on file prior to visit.     BP 118/72   Pulse 82   Temp 98.1 F (36.7 C) (Oral)   Ht  (1.6 m)   Wt 217 lb 12.8 oz (98.8 kg)   SpO2 98%   BMI 38.58 kg/m    Objective:   Physical Exam  Constitutional: She appears well-nourished.  Neck: Neck supple.  Cardiovascular: Normal rate.   Pulmonary/Chest: Effort normal.  Skin: Skin is warm.  1/2 cm open incision to right groin once packing removed. Appears to be healing well.           Assessment & Plan:  Boil:  Located to right groin. Lanced and packed at [facility] 3 days ago. Packing removed. Appears to be healing well. Repacked wound given size of open incision. Continue Doxycycline. Follow up in 3 days for re-check.  Morrie Sheldon, NP

## 2016-11-02 ENCOUNTER — Ambulatory Visit (INDEPENDENT_AMBULATORY_CARE_PROVIDER_SITE_OTHER): Payer: 59 | Admitting: Primary Care

## 2016-11-02 ENCOUNTER — Encounter: Payer: Self-pay | Admitting: Primary Care

## 2016-11-02 VITALS — BP 120/70 | HR 86 | Temp 98.5°F | Wt 215.0 lb

## 2016-11-02 DIAGNOSIS — L0291 Cutaneous abscess, unspecified: Secondary | ICD-10-CM

## 2016-11-02 NOTE — Patient Instructions (Signed)
Remove the packing Monday night next week.  Continue to cover and keep clean with a band-aid.  Please notify me if you develop increased redness/swelling/pain.  It was a pleasure to see you today!

## 2016-11-02 NOTE — Progress Notes (Signed)
   Subjective:    Patient ID: Michelle [name], female    DOB: [birth year], 26 y.o.   MRN: [id number]  HPI  Michelle [name] is a 26 year old female who presents today for follow up of abscess. She presented on 04/10 this week for evaluation of lanced abscess and removal of packing. Her abscess was lanced by Urgent Care several days prior. Her wound was evaluated that day and repacked given large open incision site. There was no signs of infection.   Since her visit on 04/10 she's noticed improvement in swelling, pain, and redness. She hardly experiences any discomfort. She is dressing her site with a large band-aid daily and is keeping site clean and dry. She denies fevers. She has been compliant to her Doxycycline and has 2 tablets remaining.  Review of Systems  Constitutional: Negative for fever.  Skin: Negative for color change.       Past Medical History:  Diagnosis Date  . Asthma   . Bipolar disorder (manic depression) (HCC)   . Dysmenorrhea   . Elevated testosterone level in female   . Hypertriglyceridemia   . Insulin resistance   . Migraines      Social History   Social History  . Marital status: Single    Spouse name: N/A  . Number of children: N/A  . Years of education: N/A   Occupational History  . Not on file.   Social History Main Topics  . Smoking status: Current Every Day Smoker    Packs/day: 1.00    Types: Cigarettes  . Smokeless tobacco: Never Used  . Alcohol use No  . Drug use: No  . Sexual activity: Yes    Birth control/ protection: None   Other Topics Concern  . Not on file   Social History Narrative   Single.   1 child.   Works as a Emergency planning/management officer.   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        Past Surgical History:  Procedure Laterality Date  . WISDOM TOOTH EXTRACTION      Family History  Problem Relation Age of Onset  . Gestational diabetes Mother   . Hyperlipidemia Maternal Grandfather     Allergies  Allergen Reactions  . Augmentin  [Amoxicillin-Pot Clavulanate] Hives    Current Outpatient Prescriptions on File Prior to Visit  Medication Sig Dispense Refill  . albuterol (PROVENTIL) (2.5 MG/3ML) 0.083% nebulizer solution Inhale 2.5 mg into the lungs every 6 (six) hours as needed for wheezing or shortness of breath.    . doxycycline (VIBRA-TABS) 100 MG tablet Take 100 mg by mouth 2 (two) times daily.     No current facility-administered medications on file prior to visit.     BP 120/70   Pulse 86   Temp 98.5 F (36.9 C) (Oral)   Wt 215 lb (97.5 kg)   SpO2 98%   BMI 38.09 kg/m    Objective:   Physical Exam  Constitutional: She appears well-nourished.  Skin: Skin is warm and dry.  Site healing well. Moderate decrease in erythema and swelling. Non tender. Wound without drainage.          Assessment & Plan:  Abscess Follow Up:  Packing removed today with presence of soaked green puss on packing. Site healing very well. Non tender. No s/s of infection. Did minimally repack site today given large incision site. Remove packing Monday next week, discussed home care instructions. Continue Doxycycline. Follow up PRN.  Michelle Sheldon, NP

## 2017-08-12 ENCOUNTER — Encounter: Payer: Self-pay | Admitting: Primary Care

## 2017-08-12 ENCOUNTER — Ambulatory Visit (INDEPENDENT_AMBULATORY_CARE_PROVIDER_SITE_OTHER): Payer: 59 | Admitting: Primary Care

## 2017-08-12 VITALS — BP 120/68 | HR 99 | Temp 98.4°F | Resp 18 | Wt 226.0 lb

## 2017-08-12 DIAGNOSIS — J019 Acute sinusitis, unspecified: Secondary | ICD-10-CM

## 2017-08-12 MED ORDER — AZITHROMYCIN 250 MG PO TABS: ORAL_TABLET | ORAL | 0 refills | Status: DC

## 2017-08-12 MED ORDER — GUAIFENESIN-CODEINE 100-10 MG/5ML PO SYRP: 5.0000 mL | ORAL_SOLUTION | Freq: Three times a day (TID) | ORAL | 0 refills | Status: DC | PRN

## 2017-08-12 NOTE — Addendum Note (Signed)
Addended by: Doreene NestLARK, Aunesti Pellegrino K on: 08/12/2017 02:42 PM   Modules accepted: Orders

## 2017-08-12 NOTE — Patient Instructions (Addendum)
Start Azithromycin antibiotics for infection. Take 2 tablets by mouth today, then 1 tablet daily for 4 additional days.  You may take the Cheratussin cough suppressant every 8 hours as needed for cough and rest. Caution this medication contains codeine and will make you feel drowsy.  Continue to work on hydration with water.  It was a pleasure to see you today!

## 2017-08-12 NOTE — Progress Notes (Signed)
Subjective:    Patient ID: Michelle [name], female    DOB: Feb 11, 1991, 26 y.o.   MRN: [id number]  HPI  Michelle [name] is a 27 year old female who presents today with a chief complaint of sinus pressure. She also reports vomiting, diarrhea, chills, body aches, shortness of breath.   She thinks she had a "stomach bug" three weeks ago with diarrhea and vomiting, this dissipated. She's not vomiting because of nausea but because of the forceful cough and is vomiting infrequently. She's having 2-3 episodes of soft and watery diarrhea for the past five days, overall improving.   Her cough began one week ago with body aches 5 days ago. She's coughing up grey/brown sputum from her chest, nothing from her nasal cavity.   She denies wheezing, fevers over the past one week.  She's taken Dayquil, Nyquil without much improvement. Her cough has not improved and she's feeling worse. The majority of her pain is to the bilateral maxillary sinuses with dental pressure.   Review of Systems  Constitutional: Positive for chills and fatigue. Negative for fever.  HENT: Positive for sinus pressure and sinus pain. Negative for ear pain and sore throat.   Respiratory: Positive for cough and shortness of breath. Negative for wheezing.   Cardiovascular: Negative for chest pain.       Past Medical History:  Diagnosis Date  . Asthma   . Bipolar disorder (manic depression) (HCC)   . Dysmenorrhea   . Elevated testosterone level in female   . Hypertriglyceridemia   . Insulin resistance   . Migraines      Social History   Socioeconomic History  . Marital status: Single    Spouse name: Not on file  . Number of children: Not on file  . Years of education: Not on file  . Highest education level: Not on file  Social Needs  . Financial resource strain: Not on file  . Food insecurity - worry: Not on file  . Food insecurity - inability: Not on file  . Transportation needs - medical: Not on file  . Transportation  needs - non-medical: Not on file  Occupational History  . Not on file  Tobacco Use  . Smoking status: Current Every Day Smoker    Packs/day: 1.00    Types: Cigarettes  . Smokeless tobacco: Never Used  Substance and Sexual Activity  . Alcohol use: No  . Drug use: No  . Sexual activity: Yes    Birth control/protection: None  Other Topics Concern  . Not on file  Social History Narrative   Single.   1 child.   Works as a Emergency planning/management officer.   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     Past Surgical History:  Procedure Laterality Date  . WISDOM TOOTH EXTRACTION      Family History  Problem Relation Age of Onset  . Gestational diabetes Mother   . Hyperlipidemia Maternal Grandfather     Allergies  Allergen Reactions  . Augmentin [Amoxicillin-Pot Clavulanate] Hives    Current Outpatient Medications on File Prior to Visit  Medication Sig Dispense Refill  . albuterol (PROVENTIL) (2.5 MG/3ML) 0.083% nebulizer solution Inhale 2.5 mg into the lungs every 6 (six) hours as needed for wheezing or shortness of breath.     No current facility-administered medications on file prior to visit.     BP 120/68 (BP Location: Left Arm, Patient Position: Sitting, Cuff Size: Normal)   Pulse 99   Temp 98.4 F (36.9 C) (  Oral)   Resp 18   Wt 226 lb (102.5 kg)   SpO2 98%   BMI 40.03 kg/m    Objective:   Physical Exam  Constitutional: She appears well-nourished. She appears ill.  HENT:  Right Ear: Tympanic membrane and ear canal normal.  Left Ear: Tympanic membrane and ear canal normal.  Nose: Mucosal edema present. Right sinus exhibits maxillary sinus tenderness. Right sinus exhibits no frontal sinus tenderness. Left sinus exhibits maxillary sinus tenderness. Left sinus exhibits no frontal sinus tenderness.  Mouth/Throat: Oropharynx is clear and moist.  Eyes: Conjunctivae are normal.  Neck: Neck supple.  Cardiovascular: Normal rate and regular rhythm.  Pulmonary/Chest: Effort normal. She  has no wheezes. She has no rhonchi. She has no rales.  Persistent dry cough during exam  Lymphadenopathy:    She has no cervical adenopathy.  Skin: Skin is warm and dry.          Assessment & Plan:  Acute Sinusitis:  Sinus pressure with dental pain, cough, fevers, fatigue x 7 days, now worse. Exam today consistent for acute early bacterial sinus involvement. Persistent cough during exam.  Rx for Zpak sent to pharmacy. Rx for Cheratussin printed for her to use PRN, drowsiness precautions provided. Fluids, rest, follow up PRN.  Morrie Sheldonlark,Katherine Kendal, NP

## 2018-05-23 ENCOUNTER — Encounter (HOSPITAL_COMMUNITY): Payer: Self-pay | Admitting: Emergency Medicine

## 2018-05-23 ENCOUNTER — Ambulatory Visit (HOSPITAL_COMMUNITY)
Admission: EM | Admit: 2018-05-23 | Discharge: 2018-05-23 | Disposition: A | Discharge status: Home / Self Care | Payer: Self-pay | Attending: Family Medicine | Admitting: Family Medicine

## 2018-05-23 DIAGNOSIS — H6501 Acute serous otitis media, right ear: Secondary | ICD-10-CM

## 2018-05-23 MED ORDER — CEFDINIR 300 MG PO CAPS: 300.0000 mg | ORAL_CAPSULE | Freq: Two times a day (BID) | ORAL | 0 refills | Status: AC

## 2018-05-23 NOTE — ED Triage Notes (Signed)
Pt c/o R ear pain, states she thinks she ruptured her ear drum two months ago, states everything sounds muffled.

## 2018-05-23 NOTE — Discharge Instructions (Addendum)
Omnicef twice daily for 10 days  Flonase 1-2 sprays in each nostril daily for 10-14 days  Stop medicine if causing rash and/or breathing issues

## 2018-05-23 NOTE — ED Provider Notes (Signed)
[facility]    CSN: [id number] Arrival date & time: 05/23/18  1222     History   Chief Complaint Chief Complaint  Patient presents with  . Appointment    1230  . Otalgia    HPI Michelle [name] is a 27 y.o. female history of asthma, insulin resistance, presenting today for evaluation of right-sided ear ringing.  Patient states that over the past few weeks she has had a sensation of muffled hearing in her right ear.  She is also more recently developed drinking.  She states that she has minimal pain.  She is concerned about possibly rupturing her eardrum as this is happened to her in the past.  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  Because of the changes in hearing she has developed a headache secondary to this.  She denies URI symptoms of congestion and sore throat.  HPI  Past Medical History:  Diagnosis Date  . Asthma   . Bipolar disorder (manic depression) (HCC)   . Dysmenorrhea   . Elevated testosterone level in female   . Hypertriglyceridemia   . Insulin resistance   . Migraines     Patient Active Problem List   Diagnosis Date Noted  . Headache, migraine 03/12/2016  . Ruptured tympanic membrane 03/12/2016  . Bipolar disorder, current episode mixed, moderate (HCC) 03/12/2016  . Morbid obesity with BMI of 40.0-44.9, adult (HCC) 03/12/2016  . Renal structural abnormality 03/12/2016    Past Surgical History:  Procedure Laterality Date  . WISDOM TOOTH EXTRACTION      OB History    Gravida  2   Para  1   Term      Preterm      AB      Living        SAB      TAB      Ectopic      Multiple      Live Births               Home Medications    Prior to Admission medications   Medication Sig Start Date End Date Taking? Authorizing Provider  albuterol (PROVENTIL) (2.5 MG/3ML) 0.083% nebulizer solution Inhale 2.5 mg into the lungs every 6 (six) hours as needed for wheezing or shortness of breath.     [provider]  cefdinir (OMNICEF) 300 MG capsule Take 1 capsule (300 mg total) by mouth 2 (two) times daily for 10 days. 05/23/18 06/02/18  ,  C, PA-C  guaiFENesin-codeine (ROBITUSSIN AC) 100-10 MG/5ML syrup Take 5 mLs by mouth 3 (three) times daily as needed for cough or congestion. 08/12/17   Doreene Nest, NP    Family History Family History  Problem Relation Age of Onset  . Gestational diabetes Mother   . Hyperlipidemia Maternal Grandfather     Social History Social History   Tobacco Use  . Smoking status: Current Every Day Smoker    Packs/day: 1.00    Types: Cigarettes  . Smokeless tobacco: Never Used  Substance Use Topics  . Alcohol use: No  . Drug use: No     Allergies   Augmentin [amoxicillin-pot clavulanate]   Review of Systems Review of Systems  Constitutional: Negative for activity change, appetite change, chills, fatigue and fever.  HENT: Positive for ear discharge, ear pain and hearing loss. Negative for congestion, rhinorrhea, sinus pressure, sore throat and trouble swallowing.   Eyes: Negative for discharge and redness.  Respiratory: Negative for cough, chest tightness and shortness of breath.   Cardiovascular: Negative for chest pain.  Gastrointestinal: Negative for abdominal pain, diarrhea, nausea and vomiting.  Musculoskeletal: Negative for myalgias.  Skin: Negative for rash.  Neurological: Negative for dizziness, light-headedness and headaches.     Physical Exam Triage Vital Signs ED Triage Vitals  Enc Vitals Group     BP 05/23/18 1239 126/80     Pulse Rate 05/23/18 1238 79     Resp 05/23/18 1238 16     Temp 05/23/18 1238 98.5 F (36.9 C)     Temp src --      SpO2 05/23/18 1238 100 %     Weight --      Height --      Head Circumference --      Peak Flow --      Pain Score 05/23/18 1238 0     Pain Loc --      Pain Edu? --      Excl. in GC? --    No data found.  Updated Vital Signs BP 126/80   Pulse 79    Temp 98.5 F (36.9 C)   Resp 16   SpO2 100%   Visual Acuity Right Eye Distance:   Left Eye Distance:   Bilateral Distance:    Right Eye Near:   Left Eye Near:    Bilateral Near:     Physical Exam  Constitutional: She appears well-developed and well-nourished. No distress.  HENT:  Head: Normocephalic and atraumatic.  Right TM with mucoid effusion, central area with erythema, left TM nonerythematous  Oral mucosa pink and moist, no tonsillar enlargement or exudate. Posterior pharynx patent and nonerythematous, no uvula deviation or swelling. Normal phonation.  Eyes: Conjunctivae are normal.  Neck: Neck supple.  Cardiovascular: Normal rate and regular rhythm.  No murmur heard. Pulmonary/Chest: Effort normal and breath sounds normal. No respiratory distress.  Breathing comfortably at rest, CTABL, no wheezing, rales or other adventitious sounds auscultated  Abdominal: Soft. There is no tenderness.  Musculoskeletal: She exhibits no edema.  Neurological: She is alert.  Skin: Skin is warm and dry.  Psychiatric: She has a normal mood and affect.  Nursing note and vitals reviewed.    UC Treatments / Results  Labs (all labs ordered are listed, but only abnormal results are displayed) Labs Reviewed - No data to display  EKG None  Radiology No results found.  Procedures Procedures (including critical care time)  Medications Ordered in UC Medications - No data to display  Initial Impression / Assessment and Plan / UC Course  I have reviewed the triage vital signs and the nursing notes.  Pertinent labs & imaging results that were available during my care of the patient were reviewed by me and considered in my medical decision making (see chart for details).     Right otitis media, will treat with Omnicef twice daily x10 days.  Patient has allergy of hives with Augmentin.  Patient states that she has tolerated amoxicillin most of her life, but recently had the hives.   States that she felt short of breath, but denies anaphylaxis.  Discussed with patient trying Omnicef versus azithromycin.  Patient opted to try [facility] and will call if she has any type of reaction.  Advised to go to emergency room if developing any shortness of breath or difficulty breathing.  Also recommended Flonase nasal spray to help with any eustachian tube dysfunction contributing to muffled sensation of hearing.Discussed strict  return precautions. Patient verbalized understanding and is agreeable with plan.  Final Clinical Impressions(s) / UC Diagnoses   Final diagnoses:  Non-recurrent acute serous otitis media of right ear     Discharge Instructions     Omnicef twice daily for 10 days  Flonase 1-2 sprays in each nostril daily for 10-14 days  Stop medicine if causing rash and/or breathing issues   ED Prescriptions    Medication Sig Dispense Auth. Provider   cefdinir (OMNICEF) 300 MG capsule Take 1 capsule (300 mg total) by mouth 2 (two) times daily for 10 days. 20 capsule ,  C, PA-C     Controlled Substance Prescriptions [place] Controlled Substance Registry consulted? Not Applicable   Lew Dawes, [place] 05/23/18 2130

## 2018-09-16 ENCOUNTER — Emergency Department
Admission: EM | Admit: 2018-09-16 | Discharge: 2018-09-16 | Disposition: A | Discharge status: Home / Self Care | Payer: Self-pay | Attending: Emergency Medicine | Admitting: Emergency Medicine

## 2018-09-16 ENCOUNTER — Encounter: Payer: Self-pay | Admitting: Emergency Medicine

## 2018-09-16 ENCOUNTER — Other Ambulatory Visit: Payer: Self-pay

## 2018-09-16 DIAGNOSIS — J45909 Unspecified asthma, uncomplicated: Secondary | ICD-10-CM | POA: Insufficient documentation

## 2018-09-16 DIAGNOSIS — F17210 Nicotine dependence, cigarettes, uncomplicated: Secondary | ICD-10-CM | POA: Insufficient documentation

## 2018-09-16 DIAGNOSIS — H60391 Other infective otitis externa, right ear: Secondary | ICD-10-CM | POA: Insufficient documentation

## 2018-09-16 DIAGNOSIS — J0100 Acute maxillary sinusitis, unspecified: Secondary | ICD-10-CM | POA: Insufficient documentation

## 2018-09-16 DIAGNOSIS — S39012A Strain of muscle, fascia and tendon of lower back, initial encounter: Secondary | ICD-10-CM | POA: Insufficient documentation

## 2018-09-16 DIAGNOSIS — Z79899 Other long term (current) drug therapy: Secondary | ICD-10-CM | POA: Insufficient documentation

## 2018-09-16 DIAGNOSIS — Y929 Unspecified place or not applicable: Secondary | ICD-10-CM | POA: Insufficient documentation

## 2018-09-16 DIAGNOSIS — Y999 Unspecified external cause status: Secondary | ICD-10-CM | POA: Insufficient documentation

## 2018-09-16 DIAGNOSIS — X58XXXA Exposure to other specified factors, initial encounter: Secondary | ICD-10-CM | POA: Insufficient documentation

## 2018-09-16 DIAGNOSIS — Y939 Activity, unspecified: Secondary | ICD-10-CM | POA: Insufficient documentation

## 2018-09-16 LAB — URINALYSIS, COMPLETE (UACMP) WITH MICROSCOPIC
Bilirubin Urine: NEGATIVE
Glucose, UA: NEGATIVE mg/dL
HGB URINE DIPSTICK: NEGATIVE
Ketones, ur: NEGATIVE mg/dL
Leukocytes,Ua: NEGATIVE
NITRITE: NEGATIVE
Protein, ur: NEGATIVE mg/dL
Specific Gravity, Urine: 1.015 (ref 1.005–1.030)
pH: 7 (ref 5.0–8.0)

## 2018-09-16 LAB — POCT PREGNANCY, URINE: Preg Test, Ur: NEGATIVE

## 2018-09-16 MED ORDER — MELOXICAM 15 MG PO TABS: 15.0000 mg | ORAL_TABLET | Freq: Every day | ORAL | 0 refills | Status: DC

## 2018-09-16 MED ORDER — DOXYCYCLINE HYCLATE 100 MG PO TABS: 100.0000 mg | ORAL_TABLET | Freq: Two times a day (BID) | ORAL | 0 refills | Status: DC

## 2018-09-16 MED ORDER — METHOCARBAMOL 500 MG PO TABS: 500.0000 mg | ORAL_TABLET | Freq: Four times a day (QID) | ORAL | 0 refills | Status: DC

## 2018-09-16 MED ORDER — NEOMYCIN-POLYMYXIN-HC 3.5-10000-1 OT SOLN: 3.0000 [drp] | Freq: Three times a day (TID) | OTIC | 0 refills | Status: AC

## 2018-09-16 NOTE — ED Provider Notes (Signed)
[facility] Emergency Department Provider Note  ____________________________________________  Time seen: Approximately 3:36 PM  I have reviewed the triage vital signs and the nursing notes.   HISTORY  Chief Complaint Headache    HPI Michelle [name] is a 28 y.o. female you presents the emergency department with multiple complaints.  Patient reports that she has had a daily headache for the past week.  Patient reports that she does have a history of migraines but typically after taking her medication the symptoms will resolve.  Patient reports that she continues to have symptoms on a daily basis.  No trauma to the head.  No loss of consciousness.  Patient reports that she does have some allergies and has had some mild nasal congestion as well as right ear pain with some drainage.  No medications for nasal congestion or ear complaint.  Patient is also concerned that she may have a urinary tract infection as she is having some mid to lower back pain and some slight burning/dysuria with urination.  No hematuria.  No history of nephrolithiasis.  Patient reports that she has a "renal structural abnormality" but is unsure exactly what that entailed.  She denies any chronic ongoing kidney issues.  Patient denies any fevers or chills, sore throat, cough, chest pain, abdominal pain, nausea vomiting, diarrhea or constipation.    Past Medical History:  Diagnosis Date  . Asthma   . Bipolar disorder (manic depression) (HCC)   . Dysmenorrhea   . Elevated testosterone level in female   . Hypertriglyceridemia   . Insulin resistance   . Migraines     Patient Active Problem List   Diagnosis Date Noted  . Headache, migraine 03/12/2016  . Ruptured tympanic membrane 03/12/2016  . Bipolar disorder, current episode mixed, moderate (HCC) 03/12/2016  . Morbid obesity with BMI of 40.0-44.9, adult (HCC) 03/12/2016  . Renal structural abnormality 03/12/2016    Past Surgical History:   Procedure Laterality Date  . WISDOM TOOTH EXTRACTION      Prior to Admission medications   Medication Sig Start Date End Date Taking? Authorizing Provider  albuterol (PROVENTIL) (2.5 MG/3ML) 0.083% nebulizer solution Inhale 2.5 mg into the lungs every 6 (six) hours as needed for wheezing or shortness of breath.    [provider]  doxycycline (VIBRA-TABS) 100 MG tablet Take 1 tablet (100 mg total) by mouth 2 (two) times daily. 09/16/18   , Delorise Royals, PA-C  guaiFENesin-codeine (ROBITUSSIN AC) 100-10 MG/5ML syrup Take 5 mLs by mouth 3 (three) times daily as needed for cough or congestion. 08/12/17   Doreene Nest, NP  meloxicam (MOBIC) 15 MG tablet Take 1 tablet (15 mg total) by mouth daily. 09/16/18   , Delorise Royals, PA-C  methocarbamol (ROBAXIN) 500 MG tablet Take 1 tablet (500 mg total) by mouth 4 (four) times daily. 09/16/18   , Delorise Royals, PA-C  neomycin-polymyxin-hydrocortisone (CORTISPORIN) OTIC solution Place 3 drops into the right ear 3 (three) times daily for 10 days. 09/16/18 09/26/18  , Delorise Royals, PA-C    Allergies Augmentin [amoxicillin-pot clavulanate]  Family History  Problem Relation Age of Onset  . Gestational diabetes Mother   . Hyperlipidemia Maternal Grandfather     Social History Social History   Tobacco Use  . Smoking status: Current Every Day Smoker    Packs/day: 1.00    Types: Cigarettes  . Smokeless tobacco: Never Used  Substance Use Topics  . Alcohol use: No  . Drug use: No  Review of Systems  Constitutional: No fever/chills Eyes: No visual changes. No discharge ENT: Mild nasal congestion and right ear pain Cardiovascular: no chest pain. Respiratory: no cough. No SOB. Gastrointestinal: No abdominal pain.  No nausea, no vomiting.  No diarrhea.  No constipation. Genitourinary: Mild dysuria.  No polyuria.. No hematuria Musculoskeletal: Positive for mid to lower back pain. Skin: Negative for rash,  abrasions, lacerations, ecchymosis. Neurological: Negative for headaches, focal weakness or numbness. 10-point ROS otherwise negative.  ____________________________________________   PHYSICAL EXAM:  VITAL SIGNS: ED Triage Vitals  Enc Vitals Group     BP 09/16/18 1432 117/81     Pulse Rate 09/16/18 1432 88     Resp 09/16/18 1432 16     Temp 09/16/18 1432 98.7 F (37.1 C)     Temp Source 09/16/18 1432 Oral     SpO2 09/16/18 1432 98 %     Weight --      Height --      Head Circumference --      Peak Flow --      Pain Score 09/16/18 1433 5     Pain Loc --      Pain Edu? --      Excl. in GC? --      Constitutional: Alert and oriented. Well appearing and in no acute distress. Eyes: Conjunctivae are normal. PERRL. EOMI. Head: Atraumatic. ENT:      Ears: EAC on right is edematous, erythematous, with drainage.  EAC on left is unremarkable.  TM on right is mildly bulging but no injection or air-fluid level.  TM on left is unremarkable.      Nose: Mild congestion/rhinnorhea.  Patient is tender to percussion over both the frontal and maxillary sinuses, worse on the right than left.      Mouth/Throat: Mucous membranes are moist.  Oropharynx is nonerythematous and nonedematous.  Uvula is midline. Neck: No stridor.  Neck is supple full range of motion Hematological/Lymphatic/Immunilogical: Scattered, nontender cervical lymphadenopathy. Cardiovascular: Normal rate, regular rhythm. Normal S1 and S2.  Good peripheral circulation. Respiratory: Normal respiratory effort without tachypnea or retractions. Lungs CTAB. Good air entry to the bases with no decreased or absent breath sounds. Gastrointestinal: Bowel sounds 4 quadrants. Soft and nontender to palpation. No guarding or rigidity. No palpable masses. No distention. No CVA tenderness. Musculoskeletal: Full range of motion to all extremities. No gross deformities appreciated.Visualization of the patient's back reveals no visible abnormality  or signs of trauma.  Patient is tender to palpation over the lower third of the thoracic spine throughout the lumbar spine.  No specific point tenderness or palpable abnormality.  No step-off.  Patient is tender to palpation over bilateral paraspinal muscle groups, worse on the right than left. Neurologic:  Normal speech and language. No gross focal neurologic deficits are appreciated.  Skin:  Skin is warm, dry and intact. No rash noted. Psychiatric: Mood and affect are normal. Speech and behavior are normal. Patient exhibits appropriate insight and judgement.   ____________________________________________   LABS (all labs ordered are listed, but only abnormal results are displayed)  Labs Reviewed  URINALYSIS, COMPLETE (UACMP) WITH MICROSCOPIC - Abnormal; Notable for the following components:      Result Value   Color, Urine YELLOW (*)    APPearance HAZY (*)    Bacteria, UA RARE (*)    All other components within normal limits  URINE CULTURE  POC URINE PREG, ED  POCT PREGNANCY, URINE   ____________________________________________  EKG   ____________________________________________  RADIOLOGY   No results found.  ____________________________________________    PROCEDURES  Procedure(s) performed:    Procedures    Medications - No data to display   ____________________________________________   INITIAL IMPRESSION / ASSESSMENT AND PLAN / ED COURSE  Pertinent labs & imaging results that were available during my care of the patient were reviewed by me and considered in my medical decision making (see chart for details).  Review of the [place] CSRS was performed in accordance of the [facility] prior to dispensing any controlled drugs.      Patient's diagnosis is consistent with right-sided otitis externa, sinusitis, lumbar strain.  Patient presents emergency department multiple complaints.  Patient does have findings consistent with bacterial sinusitis and right-sided  otitis externa.  Patient will be started on antibiotics for same.  Patient had some lower back pain with some intermittent dysuria.  Patient does have a "structural problem" with her kidneys that she is unable to describe or name.  Patient reports that she does have intermittent issues with urination but was concerned that her back pain may be related to urinary complaints.  No CVA tenderness.  Urinalysis returns without any strong indication of infection.  Patient is given meloxicam and Robaxin for symptom relief of lumbar strain.  Follow-up with primary care as needed..Patient is given ED precautions to return to the ED for any worsening or new symptoms.     ____________________________________________  FINAL CLINICAL IMPRESSION(S) / ED DIAGNOSES  Final diagnoses:  Other infective acute otitis externa of right ear  Acute non-recurrent maxillary sinusitis  Strain of lumbar region, initial encounter      NEW MEDICATIONS STARTED DURING THIS VISIT:  ED Discharge Orders         Ordered    meloxicam (MOBIC) 15 MG tablet  Daily     09/16/18 1724    methocarbamol (ROBAXIN) 500 MG tablet  4 times daily     09/16/18 1724    neomycin-polymyxin-hydrocortisone (CORTISPORIN) OTIC solution  3 times daily     09/16/18 1724    doxycycline (VIBRA-TABS) 100 MG tablet  2 times daily     09/16/18 1724              This chart was dictated using voice recognition software/Dragon. Despite best efforts to proofread, errors can occur which can change the meaning. Any change was purely unintentional.    Racheal Patches, PA-C 09/16/18 1753    Phineas Semen, MD 09/16/18 Ernestina [place]

## 2018-09-16 NOTE — ED Notes (Signed)
See triage note  Presents with headache for couple of days  Also has been having some ear pain no fever or trauma

## 2018-09-16 NOTE — ED Triage Notes (Signed)
PT c/o headache xfew days, hx of migraines. PT also states RT ear drainage. NAD noted

## 2018-09-16 NOTE — ED Notes (Signed)
POC PREG result = NEG

## 2018-09-18 LAB — URINE CULTURE: Special Requests: NORMAL

## 2019-08-05 ENCOUNTER — Ambulatory Visit: Payer: 59 | Admitting: Primary Care

## 2019-08-05 ENCOUNTER — Encounter: Payer: Self-pay | Admitting: Primary Care

## 2019-08-05 ENCOUNTER — Telehealth: Payer: Self-pay | Admitting: Primary Care

## 2019-08-05 ENCOUNTER — Other Ambulatory Visit: Payer: Self-pay

## 2019-08-05 VITALS — BP 122/76 | HR 83 | Temp 96.2°F | Ht 63.0 in | Wt 233.5 lb

## 2019-08-05 DIAGNOSIS — T148XXA Other injury of unspecified body region, initial encounter: Secondary | ICD-10-CM

## 2019-08-05 DIAGNOSIS — J4540 Moderate persistent asthma, uncomplicated: Secondary | ICD-10-CM

## 2019-08-05 DIAGNOSIS — H938X1 Other specified disorders of right ear: Secondary | ICD-10-CM

## 2019-08-05 DIAGNOSIS — L24A9 Irritant contact dermatitis due friction or contact with other specified body fluids: Secondary | ICD-10-CM

## 2019-08-05 DIAGNOSIS — L732 Hidradenitis suppurativa: Secondary | ICD-10-CM

## 2019-08-05 DIAGNOSIS — J45909 Unspecified asthma, uncomplicated: Secondary | ICD-10-CM | POA: Insufficient documentation

## 2019-08-05 MED ORDER — FLOVENT HFA 110 MCG/ACT IN AERO: 1.0000 | INHALATION_SPRAY | Freq: Two times a day (BID) | RESPIRATORY_TRACT | 2 refills | Status: DC

## 2019-08-05 MED ORDER — DOXYCYCLINE HYCLATE 100 MG PO TABS: 100.0000 mg | ORAL_TABLET | Freq: Two times a day (BID) | ORAL | 0 refills | Status: DC

## 2019-08-05 MED ORDER — ALBUTEROL SULFATE HFA 108 (90 BASE) MCG/ACT IN AERS: 1.0000 | INHALATION_SPRAY | Freq: Four times a day (QID) | RESPIRATORY_TRACT | 0 refills | Status: AC | PRN

## 2019-08-05 NOTE — Patient Instructions (Addendum)
Start Doxycycline antibiotic for the infection. Take 1 tablet by mouth twice daily for 10 days.  Stop by the front desk and speak with either Shirlee Limerick or Charmaine regarding your referral to ENT.  Please update me as discussed.  It was a pleasure to see you today!

## 2019-08-05 NOTE — Telephone Encounter (Signed)
Spoken and notified patient of Michelle [name] comments. Patient verbalized understanding. Sent the activation code via text to patient.

## 2019-08-05 NOTE — Progress Notes (Addendum)
Subjective:    Patient ID: Michelle [name], female    DOB: 24-Feb-1991, 29 y.o.   MRN: [id number]  HPI  This visit occurred during the SARS-CoV-2 public health emergency.  Safety protocols were in place, including screening questions prior to the visit, additional usage of staff PPE, and extensive cleaning of exam room while observing appropriate contact time as indicated for disinfecting solutions.   Michelle [name] is a 29 year old female who presents today with a chief complaint of decreased hearing. She would also like to discuss "boils".  1) Ear Fullness: She's been treated for "ear infections" over the last year for symptoms of ear drainage, difficulty hearing, no pain. Symptoms began about three years ago with progression over the last one year. She'll notice air coming from her right ear when blowing her nose at times, white/light brown drainage from TM during the night.   She's not used anything OTC, she doesn't like nasal sprays. She's never seen ENT. She denies left ear pain/symptoms, fevers, cough, sore throat, dizziness. Each time she's been treated with antibiotics she's not noticed any improvement, last treatment was one year ago.  BP Readings from Last 3 Encounters:  08/05/19 122/76  09/16/18 120/78  05/23/18 126/80   2) Boils: Intermittent for 15 years, located between breasts, groin bilaterally mostly. She thinks she's been diagnosed with hidradenitis by her GYN years ago, has never had Rx treatment. Size of boils will wax and wane, some come to a head and she does not expel fluid. She has an open one to her right inner breast and several to her bilateral groin. Her right groin boil has significantly changed in size overnight with tenderness and erythema.   She's recently tried OTC topical medication without much improvement.  3) Asthma: Chronic for years, currently using ProAir inhaler once to twice daily for which she's been doing for countless years. She has daily symptoms of  wheezing with shortness of breath. She continues to smoke.   She's been on Symbicort in the past with success but cannot afford to purchase. She has been getting her albuterol inhaler through "another source" but is requesting refills today.    Review of Systems  Constitutional: Negative for chills and fever.  HENT: Positive for ear discharge and hearing loss. Negative for ear pain.        Right ear drainage  Respiratory: Negative for cough.   Skin: Positive for color change.       Boils        Past Medical History:  Diagnosis Date  . Asthma   . Bipolar disorder (manic depression) ([place])   . Dysmenorrhea   . Elevated testosterone level in female   . Hypertriglyceridemia   . Insulin resistance   . Migraines      Social History   Socioeconomic History  . Marital status: Single    Spouse name: Not on file  . Number of children: Not on file  . Years of education: Not on file  . Highest education level: Not on file  Occupational History  . Not on file  Tobacco Use  . Smoking status: Current Every Day Smoker    Packs/day: 1.00    Types: Cigarettes  . Smokeless tobacco: Never Used  Substance and Sexual Activity  . Alcohol use: No  . Drug use: No  . Sexual activity: Yes    Birth control/protection: None  Other Topics Concern  . Not on file  Social History Narrative   Single.  1 child.   Works as a Emergency planning/management officer.   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    Social Determinants of Health   Financial Resource Strain:   . Difficulty of Paying Living Expenses: Not on file  Food Insecurity:   . Worried About Programme researcher, broadcasting/film/video in the Last Year: Not on file  . Ran Out of Food in the Last Year: Not on file  Transportation Needs:   . Lack of Transportation (Medical): Not on file  . Lack of Transportation (Non-Medical): Not on file  Physical Activity:   . Days of Exercise per Week: Not on file  . Minutes of Exercise per Session: Not on file  Stress:   . Feeling of Stress  : Not on file  Social Connections:   . Frequency of Communication with Friends and Family: Not on file  . Frequency of Social Gatherings with Friends and Family: Not on file  . Attends Religious Services: Not on file  . Active Member of Clubs or Organizations: Not on file  . Attends Banker Meetings: Not on file  . Marital Status: Not on file  Intimate Partner Violence:   . Fear of Current or Ex-Partner: Not on file  . Emotionally Abused: Not on file  . Physically Abused: Not on file  . Sexually Abused: Not on file    Past Surgical History:  Procedure Laterality Date  . WISDOM TOOTH EXTRACTION      Family History  Problem Relation Age of Onset  . Gestational diabetes Mother   . Hyperlipidemia Maternal Grandfather     Allergies  Allergen Reactions  . Augmentin [Amoxicillin-Pot Clavulanate] Hives  . Shrimp [Shellfish Allergy]     Current Outpatient Medications on File Prior to Visit  Medication Sig Dispense Refill  . albuterol (PROVENTIL) (2.5 MG/3ML) 0.083% nebulizer solution Inhale 2.5 mg into the lungs every 6 (six) hours as needed for wheezing or shortness of breath.     No current facility-administered medications on file prior to visit.    BP 122/76   Pulse 83   Temp (!) 96.2 F (35.7 C) (Temporal)   Ht 5\' 3"  (1.6 m)   Wt 233 lb 8 oz (105.9 kg)   SpO2 100%   BMI 41.36 kg/m    Objective:   Physical Exam  Constitutional: She appears well-nourished.  HENT:  Right Ear: No drainage or tenderness. Tympanic membrane is bulging. Tympanic membrane is not injected, not erythematous and not retracted.  Left Ear: Tympanic membrane and ear canal normal.  TM intact with evidence of whitish/brown fluid collection behind TM. No erythema.  Cardiovascular: Normal rate.  Respiratory: Effort normal.  Skin: Skin is warm and dry. There is erythema.  <0.25 cm opening to right inner breast at the top. Scant amount of whitish drainage. Evidence of several boils to  right bilateral groin, one measuring 6 cm in length and 1 cm in width. Tenderness to right groin with mild erythema.           Assessment & Plan:

## 2019-08-05 NOTE — Assessment & Plan Note (Signed)
Daily symptoms of wheezing with SOB requiring use of albuterol daily.  Discussed that daily SABA use is not best practice and that we need controller medication on board. I provided her a list of controller medications that would be appropriate for treatment, she will call her insurance and update.  Will send treatment once she calls back. Rx for Proair provided but I did discuss that we cannot to provide this monthly without LABA/ICS on board. She verbalized understanding.

## 2019-08-05 NOTE — Telephone Encounter (Signed)
Pt called her insurance company regarding inhalers  Looks like she can only afford one of the list you gave her  [employer] mentioned a couple more inhalers that would be affordable for her  pulmicort arnuity  [place] [facility]  Please call pt when this has been called in she has a couple more rx to pick and would like to pick up at same time

## 2019-08-05 NOTE — Addendum Note (Signed)
Addended by: Doreene Nest on: 08/05/2019 09:33 AM   Modules accepted: Orders

## 2019-08-05 NOTE — Telephone Encounter (Signed)
Please notify patient that I sent in a Rx for Flovent inhaler. Inhale 1 puff BID to start. Please have her update me in 2 weeks. Try to use the albuterol sparingly.   Will you get her activated on my chart?

## 2019-08-06 DIAGNOSIS — F17200 Nicotine dependence, unspecified, uncomplicated: Secondary | ICD-10-CM | POA: Insufficient documentation

## 2019-08-06 DIAGNOSIS — H7101 Cholesteatoma of attic, right ear: Secondary | ICD-10-CM | POA: Insufficient documentation

## 2019-08-06 DIAGNOSIS — H9011 Conductive hearing loss, unilateral, right ear, with unrestricted hearing on the contralateral side: Secondary | ICD-10-CM | POA: Insufficient documentation

## 2019-08-08 LAB — WOUND CULTURE
MICRO NUMBER:: 10037886
SPECIMEN QUALITY:: ADEQUATE

## 2019-08-15 ENCOUNTER — Other Ambulatory Visit: Payer: Self-pay | Admitting: Otolaryngology

## 2019-08-15 DIAGNOSIS — H7101 Cholesteatoma of attic, right ear: Secondary | ICD-10-CM

## 2019-08-15 DIAGNOSIS — H9011 Conductive hearing loss, unilateral, right ear, with unrestricted hearing on the contralateral side: Secondary | ICD-10-CM

## 2019-08-21 ENCOUNTER — Ambulatory Visit
Admission: RE | Admit: 2019-08-21 | Discharge: 2019-08-21 | Disposition: A | Discharge status: Home / Self Care | Payer: 59 | Source: Ambulatory Visit | Attending: Otolaryngology | Admitting: Otolaryngology

## 2019-08-21 DIAGNOSIS — H7101 Cholesteatoma of attic, right ear: Secondary | ICD-10-CM

## 2019-08-21 DIAGNOSIS — H9011 Conductive hearing loss, unilateral, right ear, with unrestricted hearing on the contralateral side: Secondary | ICD-10-CM

## 2019-09-10 NOTE — H&P (Signed)
HPI:   Michelle [name] is a 29 y.o. female who presents as a consult Patient.   Referring Provider: Shawnie Dapper, *  Chief complaint: Ear problem.  HPI: A few months ago she had sudden loss of hearing on the right side. She since then she is also had drainage from the ear. She uses Q-tips on occasion. She did not have a history of chronic ear infections. She has been on oral antibiotics and drops without much relief. She does not really have any nasal symptoms. She is a smoker. She has tried quitting but has had difficulty.  PMH/Meds/All/SocHx/FamHx/ROS:   Past Medical History:  Diagnosis Date  . Allergy  . Asthma   Past Surgical History:  Procedure Laterality Date  . WISDOM TOOTH EXTRACTION   No family history of bleeding disorders, wound healing problems or difficulty with anesthesia.   Social History   Socioeconomic History  . Marital status: Single  Spouse name: Not on file  . Number of children: Not on file  . Years of education: Not on file  . Highest education level: Not on file  Occupational History  . Not on file  Social Needs  . Financial resource strain: Not on file  . Food insecurity  Worry: Not on file  Inability: Not on file  . Transportation needs  Medical: Not on file  Non-medical: Not on file  Tobacco Use  . Smoking status: Current Every Day Smoker  Packs/day: 1.00  Types: Cigarettes  . Smokeless tobacco: Never Used  Substance and Sexual Activity  . Alcohol use: Not Currently  . Drug use: Never  . Sexual activity: Not on file  Lifestyle  . Physical activity  Days per week: Not on file  Minutes per session: Not on file  . Stress: Not on file  Relationships  . Social Multimedia programmer on phone: Not on file  Gets together: Not on file  Attends religious service: Not on file  Active member of club or organization: Not on file  Attends meetings of clubs or organizations: Not on file  Relationship status: Not on file  Other Topics  Concern  . Not on file  Social History Narrative  . Not on file   Current Outpatient Medications:  . albuterol 90 mcg/actuation inhaler, ProAir HFA 90 mcg/actuation aerosol inhaler, Disp: , Rfl:  . doxycycline hyclate (VIBRA-TABS) 100 MG tablet, Take 100 mg by mouth., Disp: , Rfl:  . etonogestreL (NEXPLANON) 68 mg Impl subdermal implant, Nexplanon 68 mg subdermal implant Inject 1 implant by subcutaneous route., Disp: , Rfl:   A complete ROS was performed with pertinent positives/negatives noted in the HPI. The remainder of the ROS are negative.   Physical Exam:   BP (!) 132/104  Pulse 81  Temp 97.8 F (36.6 C)  Ht 1.6 m (5\' 3" )  Wt 104.8 kg (231 lb)  BMI 40.92 kg/m   General: Overweight young lady , in no distress, breathing easily. Normal affect. In a pleasant mood. Head: Normocephalic, atraumatic. No masses, or scars. Eyes: Pupils are equal, and reactive to light. Vision is grossly intact. No spontaneous or gaze nystagmus. Ears: Left ear canal, tympanic membrane and middle ear are normal to inspection. Right ear canal with some purulent exudate that was suctioned out. The pars tensa appears to be healthy but is thickened and there is opaque middle ear effusion. There is an attic retraction that was suctioned free of epithelial debris. I cannot see the depths of the retraction. Hearing: Tuning fork  lateralizes to the right. Nose: Nasal cavities are clear with healthy mucosa, no polyps or exudate. Airways are patent. Face: No masses or scars, facial nerve function is symmetric. Oral Cavity: No mucosal abnormalities are noted. Tongue with normal mobility. Dentition appears healthy. Oropharynx: Tonsils are symmetric. There are no mucosal masses identified. Tongue base appears normal and healthy. Larynx/Hypopharynx: deferred Chest: Deferred Neck: No palpable masses, no cervical adenopathy, no thyroid nodules or enlargement. Neuro: Cranial nerves II-XII with normal function. Balance:  Normal gate. Other findings: none.  Independent Review of Additional Tests or Records:  none  Procedures:  Procedure note:  Indications: External otitis  Details of ear canal cleaning were discussed with the patient and all questions were answered.  Procedure:  Using the operating microscope, the right side was cleaned of exudate and debris using suction.   She tolerated this procedure well. There were no complications.  Impression & Plans:  Probable attic cholesteatoma on the right. Recommend CT imaging of the temporal bones. She will most likely require mastoid surgery. We will schedule for audiometric and tympanometry evaluation. Recommend she try to quit smoking.

## 2019-09-14 ENCOUNTER — Encounter (HOSPITAL_BASED_OUTPATIENT_CLINIC_OR_DEPARTMENT_OTHER): Payer: Self-pay | Admitting: Otolaryngology

## 2019-09-14 ENCOUNTER — Other Ambulatory Visit: Payer: Self-pay

## 2019-09-17 ENCOUNTER — Other Ambulatory Visit (HOSPITAL_COMMUNITY)
Admission: RE | Admit: 2019-09-17 | Discharge: 2019-09-17 | Disposition: A | Discharge status: Home / Self Care | Payer: 59 | Source: Ambulatory Visit | Attending: Otolaryngology | Admitting: Otolaryngology

## 2019-09-17 DIAGNOSIS — Z01812 Encounter for preprocedural laboratory examination: Secondary | ICD-10-CM | POA: Insufficient documentation

## 2019-09-17 DIAGNOSIS — Z20822 Contact with and (suspected) exposure to covid-19: Secondary | ICD-10-CM | POA: Diagnosis not present

## 2019-09-17 LAB — SARS CORONAVIRUS 2 (TAT 6-24 HRS): SARS Coronavirus 2: NEGATIVE

## 2019-09-21 ENCOUNTER — Encounter (HOSPITAL_BASED_OUTPATIENT_CLINIC_OR_DEPARTMENT_OTHER): Payer: Self-pay | Admitting: Otolaryngology

## 2019-09-21 ENCOUNTER — Ambulatory Visit (HOSPITAL_BASED_OUTPATIENT_CLINIC_OR_DEPARTMENT_OTHER)
Admission: RE | Admit: 2019-09-21 | Discharge: 2019-09-21 | Disposition: A | Discharge status: Home / Self Care | Payer: 59 | Attending: Otolaryngology | Admitting: Otolaryngology

## 2019-09-21 ENCOUNTER — Ambulatory Visit (HOSPITAL_BASED_OUTPATIENT_CLINIC_OR_DEPARTMENT_OTHER): Payer: 59 | Admitting: Anesthesiology

## 2019-09-21 ENCOUNTER — Other Ambulatory Visit: Payer: Self-pay

## 2019-09-21 ENCOUNTER — Encounter (HOSPITAL_BASED_OUTPATIENT_CLINIC_OR_DEPARTMENT_OTHER)
Admission: RE | Disposition: A | Discharge status: Home / Self Care | Payer: Self-pay | Source: Home / Self Care | Attending: Otolaryngology

## 2019-09-21 DIAGNOSIS — H7191 Unspecified cholesteatoma, right ear: Secondary | ICD-10-CM | POA: Diagnosis not present

## 2019-09-21 DIAGNOSIS — H9121 Sudden idiopathic hearing loss, right ear: Secondary | ICD-10-CM | POA: Diagnosis not present

## 2019-09-21 DIAGNOSIS — J45909 Unspecified asthma, uncomplicated: Secondary | ICD-10-CM | POA: Insufficient documentation

## 2019-09-21 DIAGNOSIS — F17210 Nicotine dependence, cigarettes, uncomplicated: Secondary | ICD-10-CM | POA: Diagnosis not present

## 2019-09-21 DIAGNOSIS — Z793 Long term (current) use of hormonal contraceptives: Secondary | ICD-10-CM | POA: Diagnosis not present

## 2019-09-21 DIAGNOSIS — Z79899 Other long term (current) drug therapy: Secondary | ICD-10-CM | POA: Diagnosis not present

## 2019-09-21 HISTORY — PX: TYMPANOMASTOIDECTOMY WITH RECONSTRUCTION: SHX5679

## 2019-09-21 LAB — POCT PREGNANCY, URINE: Preg Test, Ur: NEGATIVE

## 2019-09-21 SURGERY — TYMPANOMASTOIDECTOMY WITH RECONSTRUCTION
Anesthesia: General | Site: Ear | Laterality: Right

## 2019-09-21 MED ORDER — MIDAZOLAM HCL 2 MG/2ML IJ SOLN
INTRAMUSCULAR | Status: AC
  Filled 2019-09-21: qty 2

## 2019-09-21 MED ORDER — FENTANYL CITRATE (PF) 100 MCG/2ML IJ SOLN
50.0000 ug | INTRAMUSCULAR | Status: AC | PRN
  Administered 2019-09-21: 50 ug via INTRAVENOUS
  Administered 2019-09-21 (×3): 25 ug via INTRAVENOUS
  Administered 2019-09-21: 50 ug via INTRAVENOUS
  Administered 2019-09-21: 25 ug via INTRAVENOUS

## 2019-09-21 MED ORDER — HYDROMORPHONE HCL 1 MG/ML IJ SOLN
INTRAMUSCULAR | Status: AC
  Filled 2019-09-21: qty 0.5

## 2019-09-21 MED ORDER — PROPOFOL 10 MG/ML IV BOLUS
INTRAVENOUS | Status: DC | PRN
  Administered 2019-09-21: 30 mg via INTRAVENOUS
  Administered 2019-09-21: 200 mg via INTRAVENOUS

## 2019-09-21 MED ORDER — CIPROFLOXACIN-DEXAMETHASONE 0.3-0.1 % OT SUSP
OTIC | Status: DC | PRN
  Administered 2019-09-21: 4 [drp] via OTIC

## 2019-09-21 MED ORDER — EPHEDRINE SULFATE 50 MG/ML IJ SOLN
INTRAMUSCULAR | Status: DC | PRN
  Administered 2019-09-21 (×3): 10 mg via INTRAVENOUS

## 2019-09-21 MED ORDER — ONDANSETRON HCL 4 MG/2ML IJ SOLN
INTRAMUSCULAR | Status: AC
  Filled 2019-09-21: qty 2

## 2019-09-21 MED ORDER — EPINEPHRINE PF 1 MG/ML IJ SOLN
INTRAMUSCULAR | Status: AC
  Filled 2019-09-21: qty 2

## 2019-09-21 MED ORDER — ONDANSETRON HCL 4 MG/2ML IJ SOLN
INTRAMUSCULAR | Status: DC | PRN
  Administered 2019-09-21: 4 mg via INTRAVENOUS

## 2019-09-21 MED ORDER — PROMETHAZINE HCL 25 MG/ML IJ SOLN: 6.2500 mg | INTRAMUSCULAR | Status: DC | PRN

## 2019-09-21 MED ORDER — OXYCODONE HCL 5 MG/5ML PO SOLN: 5.0000 mg | Freq: Once | ORAL | Status: DC | PRN

## 2019-09-21 MED ORDER — CIPROFLOXACIN-DEXAMETHASONE 0.3-0.1 % OT SUSP
OTIC | Status: AC
  Filled 2019-09-21: qty 7.5

## 2019-09-21 MED ORDER — METHYLENE BLUE 0.5 % INJ SOLN
INTRAVENOUS | Status: AC
  Filled 2019-09-21: qty 10

## 2019-09-21 MED ORDER — MEPERIDINE HCL 25 MG/ML IJ SOLN: 6.2500 mg | INTRAMUSCULAR | Status: DC | PRN

## 2019-09-21 MED ORDER — MIDAZOLAM HCL 5 MG/5ML IJ SOLN
INTRAMUSCULAR | Status: DC | PRN
  Administered 2019-09-21: 2 mg via INTRAVENOUS

## 2019-09-21 MED ORDER — BACITRACIN ZINC 500 UNIT/GM EX OINT
TOPICAL_OINTMENT | CUTANEOUS | Status: AC
  Filled 2019-09-21: qty 0.9

## 2019-09-21 MED ORDER — BACITRACIN ZINC 500 UNIT/GM EX OINT
TOPICAL_OINTMENT | CUTANEOUS | Status: DC | PRN
  Administered 2019-09-21: 1 via TOPICAL

## 2019-09-21 MED ORDER — FENTANYL CITRATE (PF) 100 MCG/2ML IJ SOLN
INTRAMUSCULAR | Status: AC
  Filled 2019-09-21: qty 2

## 2019-09-21 MED ORDER — EPINEPHRINE PF 1 MG/ML IJ SOLN
INTRAMUSCULAR | Status: DC | PRN
  Administered 2019-09-21: 1 mg

## 2019-09-21 MED ORDER — LIDOCAINE 2% (20 MG/ML) 5 ML SYRINGE
INTRAMUSCULAR | Status: AC
  Filled 2019-09-21: qty 5

## 2019-09-21 MED ORDER — LIDOCAINE-EPINEPHRINE 1 %-1:100000 IJ SOLN
INTRAMUSCULAR | Status: AC
  Filled 2019-09-21: qty 1

## 2019-09-21 MED ORDER — OXYCODONE HCL 5 MG PO TABS: 5.0000 mg | ORAL_TABLET | Freq: Once | ORAL | Status: DC | PRN

## 2019-09-21 MED ORDER — OXYMETAZOLINE HCL 0.05 % NA SOLN
NASAL | Status: AC
  Filled 2019-09-21: qty 30

## 2019-09-21 MED ORDER — LACTATED RINGERS IV SOLN: INTRAVENOUS | Status: DC | PRN

## 2019-09-21 MED ORDER — LIDOCAINE 2% (20 MG/ML) 5 ML SYRINGE
INTRAMUSCULAR | Status: DC | PRN
  Administered 2019-09-21: 60 mg via INTRAVENOUS

## 2019-09-21 MED ORDER — LIDOCAINE-EPINEPHRINE 1 %-1:100000 IJ SOLN
INTRAMUSCULAR | Status: DC | PRN
  Administered 2019-09-21: 6.5 mL

## 2019-09-21 MED ORDER — PROMETHAZINE HCL 25 MG RE SUPP: 25.0000 mg | Freq: Four times a day (QID) | RECTAL | 1 refills | Status: DC | PRN

## 2019-09-21 MED ORDER — PROPOFOL 500 MG/50ML IV EMUL
INTRAVENOUS | Status: AC
  Filled 2019-09-21: qty 50

## 2019-09-21 MED ORDER — METHYLENE BLUE 0.5 % INJ SOLN
INTRAVENOUS | Status: DC | PRN
  Administered 2019-09-21: .25 mL via SUBMUCOSAL

## 2019-09-21 MED ORDER — HYDROMORPHONE HCL 1 MG/ML IJ SOLN
0.2500 mg | INTRAMUSCULAR | Status: DC | PRN
  Administered 2019-09-21: 11:00:00 0.5 mg via INTRAVENOUS

## 2019-09-21 MED ORDER — BUPIVACAINE HCL (PF) 0.25 % IJ SOLN
INTRAMUSCULAR | Status: AC
  Filled 2019-09-21: qty 30

## 2019-09-21 MED ORDER — LACTATED RINGERS IV SOLN: INTRAVENOUS | Status: DC

## 2019-09-21 MED ORDER — DEXAMETHASONE SODIUM PHOSPHATE 4 MG/ML IJ SOLN
INTRAMUSCULAR | Status: DC | PRN
  Administered 2019-09-21: 5 mg via INTRAVENOUS

## 2019-09-21 MED ORDER — MIDAZOLAM HCL 2 MG/2ML IJ SOLN: 1.0000 mg | INTRAMUSCULAR | Status: DC | PRN

## 2019-09-21 MED ORDER — BACITRACIN ZINC 500 UNIT/GM EX OINT
TOPICAL_OINTMENT | CUTANEOUS | Status: AC
  Filled 2019-09-21: qty 28.35

## 2019-09-21 MED ORDER — DEXAMETHASONE SODIUM PHOSPHATE 10 MG/ML IJ SOLN
INTRAMUSCULAR | Status: AC
  Filled 2019-09-21: qty 1

## 2019-09-21 MED ORDER — HYDROCODONE-ACETAMINOPHEN 7.5-325 MG PO TABS: 1.0000 | ORAL_TABLET | Freq: Four times a day (QID) | ORAL | 0 refills | Status: DC | PRN

## 2019-09-21 SURGICAL SUPPLY — 91 items
ADH SKN CLS APL DERMABOND .7 (GAUZE/BANDAGES/DRESSINGS) ×1
APL SKNCLS STERI-STRIP NONHPOA (GAUZE/BANDAGES/DRESSINGS)
BALL CTTN LRG ABS STRL LF (GAUZE/BANDAGES/DRESSINGS) ×1
BENZOIN TINCTURE PRP APPL 2/3 (GAUZE/BANDAGES/DRESSINGS) IMPLANT
BLADE CLIPPER SURG (BLADE) IMPLANT
BLADE EAR TYMPAN 2.5 60D BEAV (BLADE) IMPLANT
BLADE EYE SICKLE 84 5 BEAV (BLADE) IMPLANT
BLADE EYE SICKLE 84 5MM BEAV (BLADE)
BLADE NDL 3 SS STRL (BLADE) IMPLANT
BLADE NEEDLE 3 SS STRL (BLADE) IMPLANT
BLADE NEEDLE 3MM SS STRL (BLADE)
BLADE SURG 10 STRL SS (BLADE) ×3 IMPLANT
BNDG CONFORM 3 STRL LF (GAUZE/BANDAGES/DRESSINGS) IMPLANT
BNDG GAUZE ELAST 4 BULKY (GAUZE/BANDAGES/DRESSINGS) IMPLANT
BUR RND OSTEON ELITE 6.0 (BURR) ×1 IMPLANT
BUR RND OSTEON ELITE 6.0MM (BURR) ×1
BUR ROUNG CUTTER 5.0 (BUR) ×2 IMPLANT
BUR SABER RD CUTTING 3.0 (BURR) ×1 IMPLANT
BUR SABER RD CUTTING 3.0MM (BURR) ×1
BUR SABER TAPERED DIAMOND 1 (BURR) ×2 IMPLANT
BUR SURG RND 4.0 COARSE DIAM (BURR) ×2 IMPLANT
CANISTER SUCT 1200ML W/VALVE (MISCELLANEOUS) ×3 IMPLANT
CLEANER CAUTERY TIP 5X5 PAD (MISCELLANEOUS) ×1 IMPLANT
CLOSURE WOUND 1/2 X4 (GAUZE/BANDAGES/DRESSINGS) ×1
CLOSURE WOUND 1/4X4 (GAUZE/BANDAGES/DRESSINGS)
CORD BIPOLAR FORCEPS 12FT (ELECTRODE) IMPLANT
COTTONBALL LRG STERILE PKG (GAUZE/BANDAGES/DRESSINGS) ×3 IMPLANT
COVER WAND RF STERILE (DRAPES) IMPLANT
DECANTER SPIKE VIAL GLASS SM (MISCELLANEOUS) ×1 IMPLANT
DEPRESSOR TONGUE BLADE STERILE (MISCELLANEOUS) IMPLANT
DERMABOND ADVANCED (GAUZE/BANDAGES/DRESSINGS) ×2
DERMABOND ADVANCED .7 DNX12 (GAUZE/BANDAGES/DRESSINGS) IMPLANT
DRAPE EENT ADH APERT 31X51 STR (DRAPES) ×2 IMPLANT
DRAPE INCISE 23X17 IOBAN STRL (DRAPES)
DRAPE INCISE 23X17 STRL (DRAPES) IMPLANT
DRAPE INCISE IOBAN 23X17 STRL (DRAPES) IMPLANT
DRAPE MICROSCOPE URBAN (DRAPES) ×3 IMPLANT
DRAPE MICROSCOPE WILD 40.5X102 (DRAPES) IMPLANT
DROPPER MEDICINE STER 1.5ML LF (MISCELLANEOUS) IMPLANT
DRSG CURAD 3X16 NADH (PACKING) ×2 IMPLANT
DRSG GLASSCOCK MASTOID ADT (GAUZE/BANDAGES/DRESSINGS) ×2 IMPLANT
DRSG GLASSCOCK MASTOID PED (GAUZE/BANDAGES/DRESSINGS) IMPLANT
DRSG TELFA 3X8 NADH (GAUZE/BANDAGES/DRESSINGS) IMPLANT
ELECT COATED BLADE 2.86 ST (ELECTRODE) ×3 IMPLANT
ELECT REM PT RETURN 9FT ADLT (ELECTROSURGICAL) ×3
ELECTRODE REM PT RTRN 9FT ADLT (ELECTROSURGICAL) ×1 IMPLANT
GAUZE 4X4 16PLY RFD (DISPOSABLE) IMPLANT
GAUZE SPONGE 4X4 12PLY STRL (GAUZE/BANDAGES/DRESSINGS) IMPLANT
GAUZE SPONGE 4X4 12PLY STRL LF (GAUZE/BANDAGES/DRESSINGS) IMPLANT
GLOVE BIOGEL PI IND STRL 7.0 (GLOVE) IMPLANT
GLOVE BIOGEL PI INDICATOR 7.0 (GLOVE) ×4
GLOVE ECLIPSE 6.5 STRL STRAW (GLOVE) ×2 IMPLANT
GLOVE ECLIPSE 7.5 STRL STRAW (GLOVE) ×3 IMPLANT
GOWN STRL REUS W/ TWL LRG LVL3 (GOWN DISPOSABLE) ×2 IMPLANT
GOWN STRL REUS W/ TWL XL LVL3 (GOWN DISPOSABLE) ×1 IMPLANT
GOWN STRL REUS W/TWL LRG LVL3 (GOWN DISPOSABLE) ×3
GOWN STRL REUS W/TWL XL LVL3 (GOWN DISPOSABLE) ×3
IV CATH AUTO 14GX1.75 SAFE ORG (IV SOLUTION) IMPLANT
IV NS 500ML (IV SOLUTION) ×3
IV NS 500ML BAXH (IV SOLUTION) ×1 IMPLANT
IV SET EXT 30 76VOL 4 MALE LL (IV SETS) ×3 IMPLANT
NDL PRECISIONGLIDE 27X1.5 (NEEDLE) ×1 IMPLANT
NDL SAFETY ECLIPSE 18X1.5 (NEEDLE) ×1 IMPLANT
NEEDLE HYPO 18GX1.5 SHARP (NEEDLE) ×3
NEEDLE PRECISIONGLIDE 27X1.5 (NEEDLE) ×3 IMPLANT
NS IRRIG 1000ML POUR BTL (IV SOLUTION) ×3 IMPLANT
PACK BASIN DAY SURGERY FS (CUSTOM PROCEDURE TRAY) ×3 IMPLANT
PACK ENT DAY SURGERY (CUSTOM PROCEDURE TRAY) ×3 IMPLANT
PAD CLEANER CAUTERY TIP 5X5 (MISCELLANEOUS) ×2
PAD DRESSING TELFA 3X8 NADH (GAUZE/BANDAGES/DRESSINGS) IMPLANT
PENCIL FOOT CONTROL (ELECTRODE) ×3 IMPLANT
SHEET MEDIUM DRAPE 40X70 STRL (DRAPES) IMPLANT
SLEEVE SCD COMPRESS KNEE MED (MISCELLANEOUS) ×2 IMPLANT
SPONGE SURGIFOAM ABS GEL 12-7 (HEMOSTASIS) IMPLANT
STRIP CLOSURE SKIN 1/2X4 (GAUZE/BANDAGES/DRESSINGS) ×1 IMPLANT
STRIP CLOSURE SKIN 1/4X4 (GAUZE/BANDAGES/DRESSINGS) IMPLANT
SUT BONE WAX W31G (SUTURE) IMPLANT
SUT CHROMIC 3 0 PS 2 (SUTURE) ×2 IMPLANT
SUT CHROMIC 4 0 PS 2 18 (SUTURE) IMPLANT
SUT ETHILON 5 0 P 3 18 (SUTURE)
SUT NYLON ETHILON 5-0 P-3 1X18 (SUTURE) IMPLANT
SUT PLAIN 5 0 P 3 18 (SUTURE) IMPLANT
SUT VIC AB 3-0 FS2 27 (SUTURE) ×3 IMPLANT
SYR 5ML LL (SYRINGE) ×2 IMPLANT
SYR BULB 3OZ (MISCELLANEOUS) ×2 IMPLANT
TOWEL GREEN STERILE FF (TOWEL DISPOSABLE) ×6 IMPLANT
TRAY DSU PREP LF (CUSTOM PROCEDURE TRAY) ×3 IMPLANT
TRAY FOL W/BAG SLVR 16FR STRL (SET/KITS/TRAYS/PACK) IMPLANT
TRAY FOLEY W/BAG SLVR 14FR LF (SET/KITS/TRAYS/PACK) IMPLANT
TRAY FOLEY W/BAG SLVR 16FR LF (SET/KITS/TRAYS/PACK)
TUBING IRRIGATION (MISCELLANEOUS) ×3 IMPLANT

## 2019-09-21 NOTE — Interval H&P Note (Signed)
History and Physical Interval Note:  09/21/2019 7:16 AM  Michelle [name]  has presented today for surgery, with the diagnosis of cholesteatoma right ear.  The various methods of treatment have been discussed with the patient and family. After consideration of risks, benefits and other options for treatment, the patient has consented to  Procedure(s): TYMPANOMASTOIDECTOMY WITH RECONSTRUCTION (Right) as a surgical intervention.  The patient's history has been reviewed, patient examined, no change in status, stable for surgery.  I have reviewed the patient's chart and labs.  Questions were answered to the patient's satisfaction.     Serena Colonel

## 2019-09-21 NOTE — Op Note (Signed)
OPERATIVE REPORT  DATE OF SURGERY: 09/21/2019  PATIENT:  Michelle [name],  29 y.o. female  PRE-OPERATIVE DIAGNOSIS:  cholesteatoma right ear  POST-OPERATIVE DIAGNOSIS:  cholesteatoma right ear  PROCEDURE:  Procedure(s): TYMPANOMASTOIDECTOMY, right  SURGEON:  Susy Frizzle, MD  ASSISTANTS: None  ANESTHESIA:   General   EBL: 100 ml  DRAINS: None  LOCAL MEDICATIONS USED: 1% Xylocaine with epinephrine  SPECIMEN: Right middle ear and mastoid contents  COUNTS:  Correct  PROCEDURE DETAILS: The patient was taken to the operating room and placed on the operating table in the supine position. Following induction of general laryngeal mask airway anesthesia, the right ear was prepped and draped in a standard fashion. The ear canal was inspected.  There was purulent secretions and a large fragment of epithelial debris present within the attic defect.  This was cleaned out with suction.  The ear canal was infiltrated with local anesthetic solution in 4 quadrants.  Radial incisions were created at 9:00 and 4:00 and a vascular strip was created using a round knife.  Postauricular sulcus was infiltrated with local anesthetic and incised using electrocautery.  Graft was harvested from the loose soft tissue lateral to the temporalis muscle this was pressed and dried on the back table.  The linea temporalis and mastoid periosteum were incised down to the bone in a T fashion and a Perkins retractor was used to hold the ear forward with the vascular strip exposing the ear canal and the tympanic membrane.  The tympanum meatal flap was developed and the middle ear was entered.  The hypotympanum seem to be clear but the epitympanum was filled with soft tissue debris.  The chorda tympani was identified.  The incus long process and the stapes superstructure were identified and appeared to be free of disease.  A cortical mastoidectomy was then accomplished using a variety of cutting burs and a high-speed  otologic drill with suction irrigation.  The mastoid was entered.  There was a lot of granulation tissue and sclerotic bone but no obvious cholesteatoma matrix identified within the inferior mastoid cavity.  Initially attempts were made to preserve the posterior canal wall but ultimately when the extent of the disease was identified throughout the ossicular chain and the fossa incus decision was made to take the canal canal wall down.  Canal wall was taken down to the facial ridge.  The tegmen was exposed.  The horizontal canal was exposed.  All of this bone was intact.  There is no evidence of bony dehiscence.  The sinodural angle was exposed.  As the attic was opened and the ossicular chain to be identified, there was cholesteatoma present throughout the aditus the attic and into the epitympanum.  Cholesteatoma matrix surrounded the the head of the malleus and the body of the incus.  The incudostapedial joint was divided with an I-S knife and the malleus was cut using malleus nippers.  The incus body and malleus head were then removed from the epitympanum with surrounding cholesteatoma material.  The cholesteatoma was completely cleaned out.  Facial nerve was not with any dehiscence.  Stapes was intact.  The oval window and round window were clear.  After all of the cholesteatoma matrix was removed a series of diamond burs were then used to smooth out the remaining cavity.  The edges were beveled appropriately.  The middle ear and mastoid cavity were packed with Gelfoam first soaked in saline and then with Ciprodex.  The graft was placed overlying the facial  ridge and underneath the tympanic membrane remnant.  A meatoplasty was accomplished using vertical incisions through the superior and inferior incisura after local anesthetic was infiltrated.  The meatus was folded back on itself debriding excess soft tissue and suturing the skin back on itself using interrupted 3-0 Vicryl sutures.  The postauricular  incision was then reapproximated with a running 3-0 chromic subcuticular.  Dermabond was used on the skin.  The mastoid cavity was then packed through the meatal plasty using bacitracin soaked Adaptic dressing.  Cottonball was placed at the meatus and mastoid dressing was applied.  Patient was awakened extubated and transferred to recovery in stable condition.    PATIENT DISPOSITION:  To PACU, stable

## 2019-09-21 NOTE — Transfer of Care (Signed)
Immediate Anesthesia Transfer of Care Note  Patient: Michelle [name]  Procedure(s) Performed: TYMPANOMASTOIDECTOMY WITH RECONSTRUCTION (Right Ear)  Patient Location: PACU  Anesthesia Type:General  Level of Consciousness: sedated  Airway & Oxygen Therapy: Patient Spontanous Breathing and Patient connected to face mask oxygen  Post-op Assessment: Report given to RN and Post -op Vital signs reviewed and stable  Post vital signs: Reviewed and stable  Last Vitals:  Vitals Value Taken Time  BP 112/57 09/21/19 0951  Temp    Pulse 83 09/21/19 0955  Resp 18 09/21/19 0955  SpO2 99 % 09/21/19 0955  Vitals shown include unvalidated device data.  Last Pain:  Vitals:   09/21/19 0951  TempSrc:   PainSc: (P) Asleep         Complications: No apparent anesthesia complications

## 2019-09-21 NOTE — Anesthesia Postprocedure Evaluation (Signed)
Anesthesia Post Note  Patient: Michelle [name]  Procedure(s) Performed: TYMPANOMASTOIDECTOMY WITH RECONSTRUCTION (Right Ear)     Patient location during evaluation: PACU Anesthesia Type: General Level of consciousness: awake and alert Pain management: pain level controlled Vital Signs Assessment: post-procedure vital signs reviewed and stable Respiratory status: spontaneous breathing, nonlabored ventilation and respiratory function stable Cardiovascular status: blood pressure returned to baseline and stable Postop Assessment: no apparent nausea or vomiting Anesthetic complications: no    Last Vitals:  Vitals:   09/21/19 1045 09/21/19 1130  BP: 107/61 110/70  Pulse: 94 88  Resp: 18 (!) 22  Temp:  36.4 C  SpO2: 95% 95%    Last Pain:  Vitals:   09/21/19 1130  TempSrc: Tympanic  PainSc: 3                  Lowella Curb

## 2019-09-21 NOTE — Anesthesia Procedure Notes (Signed)
Procedure Name: LMA Insertion Date/Time: 09/21/2019 7:43 AM Performed by: Burna Cash, CRNA Pre-anesthesia Checklist: Patient identified, Emergency Drugs available, Suction available and Patient being monitored Patient Re-evaluated:Patient Re-evaluated prior to induction Oxygen Delivery Method: Circle system utilized Preoxygenation: Pre-oxygenation with 100% oxygen Induction Type: IV induction Ventilation: Mask ventilation without difficulty LMA: LMA inserted LMA Size: 4.0 Number of attempts: 1 Airway Equipment and Method: Bite block Placement Confirmation: positive ETCO2 Tube secured with: Tape Dental Injury: Teeth and Oropharynx as per pre-operative assessment

## 2019-09-21 NOTE — Discharge Instructions (Signed)
Remove the dressing Wednesday morning.  Take the Velcro strap off.  The entire dressing then comes off easily.  Remove the adhesive pad from the forehead.  Gently pull off the dressing behind the ear.  Remove the cotton ball from the ear and replace it with a fresh cottonball.  Do this as needed.  Keep all water out of the ear.   Post Anesthesia Home Care Instructions  Activity: Get plenty of rest for the remainder of the day. A responsible individual must stay with you for 24 hours following the procedure.  For the next 24 hours, DO NOT: -Drive a car -Advertising copywriter -Drink alcoholic beverages -Take any medication unless instructed by your physician -Make any legal decisions or sign important papers.  Meals: Start with liquid foods such as gelatin or soup. Progress to regular foods as tolerated. Avoid greasy, spicy, heavy foods. If nausea and/or vomiting occur, drink only clear liquids until the nausea and/or vomiting subsides. Call your physician if vomiting continues.  Special Instructions/Symptoms: Your throat may feel dry or sore from the anesthesia or the breathing tube placed in your throat during surgery. If this causes discomfort, gargle with warm salt water. The discomfort should disappear within 24 hours.  If you had a scopolamine patch placed behind your ear for the management of post- operative nausea and/or vomiting:  1. The medication in the patch is effective for 72 hours, after which it should be removed.  Wrap patch in a tissue and discard in the trash. Wash hands thoroughly with soap and water. 2. You may remove the patch earlier than 72 hours if you experience unpleasant side effects which may include dry mouth, dizziness or visual disturbances. 3. Avoid touching the patch. Wash your hands with soap and water after contact with the patch.

## 2019-09-21 NOTE — Anesthesia Preprocedure Evaluation (Signed)
Anesthesia Evaluation  Patient identified by MRN, date of birth, ID band Patient awake    Reviewed: Allergy & Precautions, NPO status , Patient's Chart, lab work & pertinent test results  Airway Mallampati: II  TM Distance: >3 FB Neck ROM: Full    Dental no notable dental hx.    Pulmonary asthma , Current Smoker and Patient abstained from smoking.,    Pulmonary exam normal breath sounds clear to auscultation       Cardiovascular negative cardio ROS Normal cardiovascular exam Rhythm:Regular Rate:Normal     Neuro/Psych  Headaches, Bipolar Disorder negative psych ROS   GI/Hepatic negative GI ROS, Neg liver ROS,   Endo/Other  Morbid obesity  Renal/GU negative Renal ROS  negative genitourinary   Musculoskeletal negative musculoskeletal ROS (+)   Abdominal (+) + obese,   Peds negative pediatric ROS (+)  Hematology negative hematology ROS (+)   Anesthesia Other Findings   Reproductive/Obstetrics negative OB ROS                             Anesthesia Physical Anesthesia Plan  ASA: III  Anesthesia Plan: General   Post-op Pain Management:    Induction: Intravenous  PONV Risk Score and Plan: 2 and Ondansetron, Midazolam and Treatment may vary due to age or medical condition  Airway Management Planned: LMA  Additional Equipment:   Intra-op Plan:   Post-operative Plan: Extubation in OR  Informed Consent: I have reviewed the patients History and Physical, chart, labs and discussed the procedure including the risks, benefits and alternatives for the proposed anesthesia with the patient or authorized representative who has indicated his/her understanding and acceptance.     Dental advisory given  Plan Discussed with: CRNA  Anesthesia Plan Comments:         Anesthesia Quick Evaluation

## 2019-09-22 LAB — SURGICAL PATHOLOGY

## 2020-07-04 ENCOUNTER — Other Ambulatory Visit: Payer: Self-pay

## 2020-07-04 ENCOUNTER — Ambulatory Visit: Payer: Self-pay | Admitting: Physician Assistant

## 2020-07-04 ENCOUNTER — Encounter: Payer: Self-pay | Admitting: Physician Assistant

## 2020-07-04 DIAGNOSIS — L732 Hidradenitis suppurativa: Secondary | ICD-10-CM | POA: Insufficient documentation

## 2020-07-04 DIAGNOSIS — Z113 Encounter for screening for infections with a predominantly sexual mode of transmission: Secondary | ICD-10-CM

## 2020-07-04 DIAGNOSIS — Z7189 Other specified counseling: Secondary | ICD-10-CM

## 2020-07-04 LAB — WET PREP FOR TRICH, YEAST, CLUE
Trichomonas Exam: NEGATIVE
Yeast Exam: NEGATIVE

## 2020-07-04 NOTE — Progress Notes (Signed)
[facility] Department STI clinic/screening visit  Subjective:  Michelle [name] is a 29 y.o. female being seen today for an STI screening visit. The patient reports they do not have symptoms.  Patient reports that they do not desire a pregnancy in the next year.   They reported they are not interested in discussing contraception today.  Patient's last menstrual period was 06/15/2020.   Patient has the following medical conditions:   Patient Active Problem List   Diagnosis Date Noted  . Hidradenitis suppurativa 07/04/2020  . Cholesteatoma of attic of right ear 08/06/2019  . Conductive hearing loss of right ear with unrestricted hearing of left ear 08/06/2019  . Asthma 08/05/2019  . Headache, migraine 03/12/2016  . Ruptured tympanic membrane 03/12/2016  . Bipolar disorder, current episode mixed, moderate (HCC) 03/12/2016  . Morbid obesity with BMI of 40.0-44.9, adult (HCC) 03/12/2016  . Renal structural abnormality 03/12/2016    Chief Complaint  Patient presents with  . SEXUALLY TRANSMITTED DISEASE    screening    HPI  Patient reports that she is not having any symptoms but would like a screening today.  Reports a history of asthma, anxiety and depression.  States that she uses an inhaler as directed.  Reports last HIV test was 2 yr ago when she had her last pap.  States that she has a Nexplanon that recently expired and is currently using abstinence as her BCM.    See flowsheet for further details and programmatic requirements.    The following portions of the patient's history were reviewed and updated as appropriate: allergies, current medications, past medical history, past social history, past surgical history and problem list.  Objective:  There were no vitals filed for this visit.  Physical Exam Constitutional:      General: She is not in acute distress.    Appearance: Normal appearance.  HENT:     Head: Normocephalic and atraumatic.     Comments: No  nits,lice, or hair loss. No cervical, supraclavicular or axillary adenopathy. Eyes:     Conjunctiva/sclera: Conjunctivae normal.  Pulmonary:     Effort: Pulmonary effort is normal.  Musculoskeletal:     Cervical back: Neck supple. No tenderness.  Skin:    General: Skin is warm and dry.     Findings: No bruising, erythema, lesion or rash.  Neurological:     Mental Status: She is alert and oriented to person, place, and time.  Psychiatric:        Mood and Affect: Mood normal.        Behavior: Behavior normal.        Thought Content: Thought content normal.        Judgment: Judgment normal.      Assessment and Plan:  Michelle [name] is a 29 y.o. female presenting to the [facility] for STI screening  1. Screening for STD (sexually transmitted disease) Patient into clinic without symptoms. Reviewed wet mount results with patient and no treatment indicated today.  Rec condoms with all sex. Await test results.  Counseled that RN will call if needs to RTC for treatment once results are back. - WET PREP FOR TRICH, YEAST, CLUE - Chlamydia/Gonorrhea [facility] - HIV [place] [facility] - Syphilis Serology, [facility]  2. Other specified counseling Counseled patient re: sliding fee scale for [facility] services and patient states she will call back for an appointment after the new year. LCSW, [employer], and Quitline info given to patient  per her request for resources.     No follow-ups on file.  No future appointments.  Matt Holmes, PA

## 2020-09-09 ENCOUNTER — Telehealth (INDEPENDENT_AMBULATORY_CARE_PROVIDER_SITE_OTHER): Payer: Self-pay | Admitting: Internal Medicine

## 2020-09-09 ENCOUNTER — Encounter: Payer: Self-pay | Admitting: Internal Medicine

## 2020-09-09 ENCOUNTER — Other Ambulatory Visit: Payer: Self-pay

## 2020-09-09 VITALS — Temp 98.0°F

## 2020-09-09 DIAGNOSIS — J019 Acute sinusitis, unspecified: Secondary | ICD-10-CM

## 2020-09-09 DIAGNOSIS — B9689 Other specified bacterial agents as the cause of diseases classified elsewhere: Secondary | ICD-10-CM

## 2020-09-09 MED ORDER — AZITHROMYCIN 250 MG PO TABS: ORAL_TABLET | ORAL | 0 refills | Status: DC

## 2020-09-09 NOTE — Patient Instructions (Signed)

## 2020-09-09 NOTE — Progress Notes (Signed)
Virtual Visit via Video Note  I connected with Michelle [name] on 09/09/20 at  4:15 PM EST by a video enabled telemedicine application and verified that I am speaking with the correct person using two identifiers.  Location: Patient: Home Provider: Office  Person's participating in this video call: Webb Silversmith, NP-C and Rohini Jaroszewski.   I discussed the limitations of evaluation and management by telemedicine and the availability of in person appointments. The patient expressed understanding and agreed to proceed.  History of Present Illness:  Pt reports headache, nasal congestion, ear pain, sore throat and cough. This started 10 days ago. The headache is located in her right temple and forehead. She describes the pain as pressure. She denies dizziness or visual changes. She reports her right ear feels like pressure, but she denies drainage or loss of hearing. She is blowing clear mucous out of her nose. She denies difficulty swallowing. The cough is productive of white/yellow mucous. She denies runny nose, loss of taste/smell or SOB. She denies fever, chills or body aches. She has tried Cough, Dayquil, Delsym and Tylenol OTC with minimal relief of symptoms. She has a history of asthma and is prone to sinus infections. She denies Covid exposure. She is not vaccinated. She has had 2 negative home Covid test.  Past Medical History:  Diagnosis Date  . Asthma   . Bipolar disorder (manic depression) ([place])   . Dysmenorrhea   . Elevated testosterone level in female   . Hypertriglyceridemia   . Insulin resistance   . Migraines     Current Outpatient Medications  Medication Sig Dispense Refill  . albuterol (PROAIR HFA) 108 (90 Base) MCG/ACT inhaler Inhale 1-2 puffs into the lungs every 6 (six) hours as needed for wheezing or shortness of breath. 18 g 0  . etonogestrel (NEXPLANON) 68 MG IMPL implant Nexplanon 68 mg subdermal implant  Inject 1 implant by subcutaneous route.    . fluticasone  (FLOVENT HFA) 110 MCG/ACT inhaler Inhale 1 puff into the lungs 2 (two) times daily. For asthma 1 Inhaler 2  . HYDROcodone-acetaminophen (NORCO) 7.5-325 MG tablet Take 1 tablet by mouth every 6 (six) hours as needed for moderate pain. 20 tablet 0  . promethazine (PHENERGAN) 25 MG suppository Place 1 suppository (25 mg total) rectally every 6 (six) hours as needed for nausea or vomiting. 12 suppository 1   No current facility-administered medications for this visit.    Allergies  Allergen Reactions  . Augmentin [Amoxicillin-Pot Clavulanate] Hives  . Shrimp [Shellfish Allergy]     Family History  Problem Relation Age of Onset  . Gestational diabetes Mother   . Hyperlipidemia Maternal Grandfather     Social History   Socioeconomic History  . Marital status: Single    Spouse name: Not on file  . Number of children: Not on file  . Years of education: Not on file  . Highest education level: Not on file  Occupational History  . Not on file  Tobacco Use  . Smoking status: Current Every Day Smoker    Packs/day: 1.00    Types: Cigarettes  . Smokeless tobacco: Never Used  Substance and Sexual Activity  . Alcohol use: No  . Drug use: No  . Sexual activity: Yes    Birth control/protection: None  Other Topics Concern  . Not on file  Social History Narrative   Single.   1 child.   Works as a Government social research officer.   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  Social Determinants of Health   Financial Resource Strain: Not on file  Food Insecurity: Not on file  Transportation Needs: Not on file  Physical Activity: Not on file  Stress: Not on file  Social Connections: Not on file  Intimate Partner Violence: Not on file     Constitutional: Pt reports headache. Denies fever, malaise, fatigue, or abrupt weight changes.  HEENT: Pt reports nasal congestion, ear pain, sore throat. Denies eye pain, eye redness, ringing in the ears, wax buildup, runny nose, bloody nose. Respiratory: Pt reports  cough. Denies difficulty breathing, shortness of breath, or sputum production.   Cardiovascular: Denies chest pain, chest tightness, palpitations or swelling in the hands or feet.  Gastrointestinal: Denies abdominal pain, bloating, constipation, diarrhea or blood in the stool.    No other specific complaints in a complete review of systems (except as listed in HPI above).  Observations/Objective: Temp 98 F (36.7 C) (Oral)   Wt Readings from Last 3 Encounters:  09/21/19 233 lb 14.5 oz (106.1 kg)  08/05/19 233 lb 8 oz (105.9 kg)  08/12/17 226 lb (102.5 kg)    General: Appears her stated age, in NAD. HEENT: Head: normal shape and size;  Nose: congestion noted ; Throat/Mouth: slight hoarseness noted Pulmonary/Chest: Normal effort. No respiratory distress.  Neurological: Alert and oriented.   BMET    Component Value Date/Time   NA 140 03/12/2016 1707   K 4.1 03/12/2016 1707   CL 105 03/12/2016 1707   CO2 26 03/12/2016 1707   GLUCOSE 72 03/12/2016 1707   BUN 10 03/12/2016 1707   CREATININE 1.04 03/12/2016 1707   CALCIUM 9.2 03/12/2016 1707   GFRNONAA >60 05/18/2010 0505   GFRAA  05/18/2010 0505    >60        The eGFR has been calculated using the MDRD equation. This calculation has not been validated in all clinical situations. eGFR's persistently <60 mL/min signify possible Chronic Kidney Disease.    Lipid Panel  No results found for: CHOL, TRIG, HDL, CHOLHDL, VLDL, LDLCALC  CBC    Component Value Date/Time   WBC 12.9 (H) 02/07/2012 1240   RBC 5.15 (H) 02/07/2012 1240   HGB 15.1 (H) 02/07/2012 1240   HCT 42.8 02/07/2012 1240   PLT 337 02/07/2012 1240   MCV 83.1 02/07/2012 1240   MCH 29.3 02/07/2012 1240   MCHC 35.3 02/07/2012 1240   RDW 13.7 02/07/2012 1240    Hgb A1C No results found for: HGBA1C      Assessment and Plan:  Acute Bacterial Sinusitis:  Can use a Neti Pot purchased from [employer] for Azithromycin 250 mg PO daily x 5  days If no improvement, consider Pred Taper x 6 days  Return precautions discussed Follow Up Instructions:    I discussed the assessment and treatment plan with the patient. The patient was provided an opportunity to ask questions and all were answered. The patient agreed with the plan and demonstrated an understanding of the instructions.   The patient was advised to call back or seek an in-person evaluation if the symptoms worsen or if the condition fails to improve as anticipated.    Webb Silversmith, NP

## 2020-09-16 ENCOUNTER — Telehealth: Payer: Self-pay

## 2020-09-16 DIAGNOSIS — J019 Acute sinusitis, unspecified: Secondary | ICD-10-CM

## 2020-09-16 DIAGNOSIS — B9689 Other specified bacterial agents as the cause of diseases classified elsewhere: Secondary | ICD-10-CM

## 2020-09-16 MED ORDER — PREDNISONE 20 MG PO TABS: ORAL_TABLET | ORAL | 0 refills | Status: DC

## 2020-09-16 NOTE — Telephone Encounter (Signed)
Pt left a message on triage line that she was told by Nicki Reaper to call back and get a steroid if she was not any better after her recent VV 09-09-20. Called pt back and she said she still has a deep cough and a mild headache. Asking if Jae Dire will call in a steroid in [facility] absence. Please advise at [phone].

## 2020-09-16 NOTE — Addendum Note (Signed)
Addended by: Doreene Nest on: 09/16/2020 04:26 PM   Modules accepted: Orders

## 2020-09-16 NOTE — Telephone Encounter (Signed)
Noted, will send in prednisone (steroid) course to pharmacy now.

## 2020-11-21 ENCOUNTER — Encounter: Payer: Self-pay | Admitting: Family Medicine

## 2020-11-21 ENCOUNTER — Other Ambulatory Visit: Payer: Self-pay

## 2020-11-21 ENCOUNTER — Ambulatory Visit: Payer: Self-pay | Admitting: Family Medicine

## 2020-11-21 VITALS — BP 120/84 | HR 86 | Temp 98.3°F | Ht 63.0 in | Wt 242.0 lb

## 2020-11-21 DIAGNOSIS — K219 Gastro-esophageal reflux disease without esophagitis: Secondary | ICD-10-CM

## 2020-11-21 DIAGNOSIS — R1031 Right lower quadrant pain: Secondary | ICD-10-CM

## 2020-11-21 DIAGNOSIS — R102 Pelvic and perineal pain: Secondary | ICD-10-CM

## 2020-11-21 DIAGNOSIS — R1032 Left lower quadrant pain: Secondary | ICD-10-CM

## 2020-11-21 HISTORY — DX: Right lower quadrant pain: R10.31

## 2020-11-21 LAB — POC URINALSYSI DIPSTICK (AUTOMATED)
Bilirubin, UA: NEGATIVE
Blood, UA: NEGATIVE
Glucose, UA: NEGATIVE
Ketones, UA: NEGATIVE
Leukocytes, UA: NEGATIVE
Nitrite, UA: NEGATIVE
Protein, UA: NEGATIVE
Spec Grav, UA: 1.025 (ref 1.010–1.025)
Urobilinogen, UA: 0.2 E.U./dL
pH, UA: 6 (ref 5.0–8.0)

## 2020-11-21 LAB — POCT URINE PREGNANCY: Preg Test, Ur: NEGATIVE

## 2020-11-21 NOTE — Assessment & Plan Note (Signed)
Avoid triggers, Start prilosec 20 mg daily. Info given.

## 2020-11-21 NOTE — Progress Notes (Signed)
Patient ID: Michelle [name], female    DOB: 1991-07-12, 29 y.o.   MRN: [id number]  This visit was conducted in person.  BP 120/84   Pulse 86   Temp 98.3 F (36.8 C) (Temporal)   Ht 5\' 3"  (1.6 m)   Wt 242 lb (109.8 kg)   SpO2 98%   BMI 42.87 kg/m    CC:  Chief Complaint  Patient presents with  . Abdominal Pain    Lower   . Bloated  . Heartburn    Subjective:   HPI: Michelle [name] is a 30 y.o. female presenting on 11/21/2020 for Abdominal Pain (Lower ), Bloated, and Heartburn  She reports 2 weeks ago she started having vaginal pressure ( felt like something pushing out). Now occurs intermittently. Worse the longer she stands each day.Now when she goes to urinate she has lower abdominal cramping to sharp pain. No dysuria, no change in frequency, no urgency... but cannot hold pee because lower abdomen starts to cramp.  She has noted similar pain when has bowel movement as well.4 BM regular for her 1-2 times a day.. normal consistency.. no straining.   NSVD x 1 in past. She has not had sex in a few months,  No new partners. No STD in last 10 years... 15 years ago chlamydia, trichomonas. No past abdominal surgeries.   Irregular menses  last menstrual end of February.  No vaginal discharge or itching. She has Nexplanon in place since 06/2016... Dr. 07/2016.  She has heartburn.. triggered when bending over.  Decreased appetite and has bloating after eating.  Using Tums... helps temporarily.  Reviewed last STD tests from 06/2020  She has take home UTI test.. clear.       Relevant past medical, surgical, family and social history reviewed and updated as indicated. Interim medical history since our last visit reviewed. Allergies and medications reviewed and updated. Outpatient Medications Prior to Visit  Medication Sig Dispense Refill  . albuterol (PROAIR HFA) 108 (90 Base) MCG/ACT inhaler Inhale 1-2 puffs into the lungs every 6 (six) hours as needed for wheezing or  shortness of breath. 18 g 0  . etonogestrel (NEXPLANON) 68 MG IMPL implant Nexplanon 68 mg subdermal implant  Inject 1 implant by subcutaneous route.    . fluticasone (FLOVENT HFA) 110 MCG/ACT inhaler Inhale 1 puff into the lungs 2 (two) times daily. For asthma 1 Inhaler 2  . azithromycin (ZITHROMAX) 250 MG tablet Take 2 tabs today, then 1 tab daily x 4 days 6 tablet 0  . predniSONE (DELTASONE) 20 MG tablet Take 2 tablets by mouth once daily for 5 days. 10 tablet 0   No facility-administered medications prior to visit.     Per HPI unless specifically indicated in ROS section below Review of Systems  Constitutional: Negative for fatigue and fever.  HENT: Negative for congestion.   Eyes: Negative for pain.  Respiratory: Negative for cough and shortness of breath.   Cardiovascular: Negative for chest pain, palpitations and leg swelling.  Gastrointestinal: Positive for abdominal distention and abdominal pain. Negative for anal bleeding, blood in stool, constipation, diarrhea, nausea, rectal pain and vomiting.  Genitourinary: Negative for dysuria and vaginal bleeding.  Musculoskeletal: Negative for back pain.  Neurological: Negative for syncope, light-headedness and headaches.  Psychiatric/Behavioral: Negative for dysphoric mood.   Objective:  BP 120/84   Pulse 86   Temp 98.3 F (36.8 C) (Temporal)   Ht 5\' 3"  (1.6 m)   Wt 242 lb (109.8 kg)  SpO2 98%   BMI 42.87 kg/m   Wt Readings from Last 3 Encounters:  11/21/20 242 lb (109.8 kg)  09/21/19 233 lb 14.5 oz (106.1 kg)  08/05/19 233 lb 8 oz (105.9 kg)      Physical Exam Constitutional:      General: She is not in acute distress.    Appearance: Normal appearance. She is well-developed. She is obese. She is not ill-appearing or toxic-appearing.  HENT:     Head: Normocephalic.     Right Ear: Hearing, tympanic membrane, ear canal and external ear normal. Tympanic membrane is not erythematous, retracted or bulging.     Left Ear:  Hearing, tympanic membrane, ear canal and external ear normal. Tympanic membrane is not erythematous, retracted or bulging.     Nose: No mucosal edema or rhinorrhea.     Right Sinus: No maxillary sinus tenderness or frontal sinus tenderness.     Left Sinus: No maxillary sinus tenderness or frontal sinus tenderness.     Mouth/Throat:     Pharynx: Uvula midline.  Eyes:     General: Lids are normal. Lids are everted, no foreign bodies appreciated.     Conjunctiva/sclera: Conjunctivae normal.     Pupils: Pupils are equal, round, and reactive to light.  Neck:     Thyroid: No thyroid mass or thyromegaly.     Vascular: No carotid bruit.     Trachea: Trachea normal.  Cardiovascular:     Rate and Rhythm: Normal rate and regular rhythm.     Pulses: Normal pulses.     Heart sounds: Normal heart sounds, S1 normal and S2 normal. No murmur heard. No friction rub. No gallop.   Pulmonary:     Effort: Pulmonary effort is normal. No tachypnea or respiratory distress.     Breath sounds: Normal breath sounds. No decreased breath sounds, wheezing, rhonchi or rales.  Abdominal:     General: Bowel sounds are normal.     Palpations: Abdomen is soft.     Tenderness: There is generalized abdominal tenderness and tenderness in the right lower quadrant, epigastric area, suprapubic area and left lower quadrant. There is no right CVA tenderness or left CVA tenderness. Negative signs include Murphy's sign and McBurney's sign.     Hernia: No hernia is present. There is no hernia in the left inguinal area, right femoral area, left femoral area or right inguinal area.     Comments: Cramping caused centrally in lower abdomen with vaginal manual exam.. no fibroids palpated or uterine enlargement but difficult to examine due to morbid obesity.  Musculoskeletal:     Cervical back: Normal range of motion and neck supple.  Skin:    General: Skin is warm and dry.     Findings: No rash.  Neurological:     Mental Status: She  is alert.  Psychiatric:        Mood and Affect: Mood is not anxious or depressed.        Speech: Speech normal.        Behavior: Behavior normal. Behavior is cooperative.        Thought Content: Thought content normal.        Judgment: Judgment normal.       Results for orders placed or performed in visit on 07/04/20  WET PREP FOR TRICH, YEAST, CLUE  Result Value Ref Range   Trichomonas Exam Negative Negative   Yeast Exam Negative Negative   Clue Cell Exam Comment: Negative    This visit  occurred during the SARS-CoV-2 public health emergency.  Safety protocols were in place, including screening questions prior to the visit, additional usage of staff PPE, and extensive cleaning of exam room while observing appropriate contact time as indicated for disinfecting solutions.   COVID 19 screen:  No recent travel or known exposure to COVID19 The patient denies respiratory symptoms of COVID 19 at this time. The importance of social distancing was discussed today.   Assessment and Plan Problem List Items Addressed This Visit    Bilateral lower abdominal cramping - Primary    Neg UA, neg Upreg, normal wet prep  Vaginal exam normal but difficult given obesity.. no lesions,no vaginal/uterine prolapse  Most likely focus of pain is uterine.  Pt does not have insurance so will try to target testing if able.  Start with pelvic US to eval.      Relevant Orders   POCT Urinalysis Dipstick (Automated) (Completed)   POCT urine pregnancy (Completed)   US Pelvic Complete With Transvaginal   Gastroesophageal reflux disease without esophagitis    Avoid triggers, Start prilosec 20 mg daily. Info given.       Other Visit Diagnoses    Vaginal pain       Relevant Orders   US Pelvic Complete With Transvaginal         Kerby Nora, MD

## 2020-11-21 NOTE — Patient Instructions (Addendum)
Avoid reflux triggers.  Start prilosec 20 mg daily x 4-6 weeks.. call if not improvement in next 2 weeks.    We will set up an ultrasound to evaluate the lower abd cramping.       Conn's Current Therapy 2021 (pp. 213-216). [place], [place]: Elsevier.">  Gastroesophageal Reflux Disease, Adult Gastroesophageal reflux (GER) happens when acid from the stomach flows up into the tube that connects the mouth and the stomach (esophagus). Normally, food travels down the esophagus and stays in the stomach to be digested. However, when a person has GER, food and stomach acid sometimes move back up into the esophagus. If this becomes a more serious problem, the person may be diagnosed with a disease called gastroesophageal reflux disease (GERD). GERD occurs when the reflux:  Happens often.  Causes frequent or severe symptoms.  Causes problems such as damage to the esophagus. When stomach acid comes in contact with the esophagus, the acid may cause inflammation in the esophagus. Over time, GERD may create small holes (ulcers) in the lining of the esophagus. What are the causes? This condition is caused by a problem with the muscle between the esophagus and the stomach (lower esophageal sphincter, or LES). Normally, the LES muscle closes after food passes through the esophagus to the stomach. When the LES is weakened or abnormal, it does not close properly, and that allows food and stomach acid to go back up into the esophagus. The LES can be weakened by certain dietary substances, medicines, and medical conditions, including:  Tobacco use.  Pregnancy.  Having a hiatal hernia.  Alcohol use.  Certain foods and beverages, such as coffee, chocolate, onions, and peppermint. What increases the risk? You are more likely to develop this condition if you:  Have an increased body weight.  Have a connective tissue disorder.  Take NSAIDs, such as ibuprofen. What are the signs or symptoms? Symptoms  of this condition include:  Heartburn.  Difficult or painful swallowing and the feeling of having a lump in the throat.  A bitter taste in the mouth.  Bad breath and having a large amount of saliva.  Having an upset or bloated stomach and belching.  Chest pain. Different conditions can cause chest pain. Make sure you see your health care provider if you experience chest pain.  Shortness of breath or wheezing.  Ongoing (chronic) cough or a nighttime cough.  Wearing away of tooth enamel.  Weight loss. How is this diagnosed? This condition may be diagnosed based on a medical history and a physical exam. To determine if you have mild or severe GERD, your health care provider may also monitor how you respond to treatment. You may also have tests, including:  A test to examine your stomach and esophagus with a small camera (endoscopy).  A test that measures the acidity level in your esophagus.  A test that measures how much pressure is on your esophagus.  A barium swallow or modified barium swallow test to show the shape, size, and functioning of your esophagus. How is this treated? Treatment for this condition may vary depending on how severe your symptoms are. Your health care provider may recommend:  Changes to your diet.  Medicine.  Surgery. The goal of treatment is to help relieve your symptoms and to prevent complications. Follow these instructions at home: Eating and drinking  Follow a diet as recommended by your health care provider. This may involve avoiding foods and drinks such as: ? Coffee and tea, with or without  caffeine. ? Drinks that contain alcohol. ? Energy drinks and sports drinks. ? Carbonated drinks or sodas. ? Chocolate and cocoa. ? Peppermint and mint flavorings. ? Garlic and onions. ? Horseradish. ? Spicy and acidic foods, including peppers, chili powder, curry powder, vinegar, hot sauces, and barbecue sauce. ? Citrus fruit juices and citrus  fruits, such as oranges, lemons, and limes. ? Tomato-based foods, such as red sauce, chili, salsa, and pizza with red sauce. ? Fried and fatty foods, such as donuts, french fries, potato chips, and high-fat dressings. ? High-fat meats, such as hot dogs and fatty cuts of red and white meats, such as rib eye steak, sausage, ham, and bacon. ? High-fat dairy items, such as whole milk, butter, and cream cheese.  Eat small, frequent meals instead of large meals.  Avoid drinking large amounts of liquid with your meals.  Avoid eating meals during the 2-3 hours before bedtime.  Avoid lying down right after you eat.  Do not exercise right after you eat.   Lifestyle  Do not use any products that contain nicotine or tobacco. These products include cigarettes, chewing tobacco, and vaping devices, such as e-cigarettes. If you need help quitting, ask your health care provider.  Try to reduce your stress by using methods such as yoga or meditation. If you need help reducing stress, ask your health care provider.  If you are overweight, reduce your weight to an amount that is healthy for you. Ask your health care provider for guidance about a safe weight loss goal.   General instructions  Pay attention to any changes in your symptoms.  Take over-the-counter and prescription medicines only as told by your health care provider. Do not take aspirin, ibuprofen, or other NSAIDs unless your health care provider told you to take these medicines.  Wear loose-fitting clothing. Do not wear anything tight around your waist that causes pressure on your abdomen.  Raise (elevate) the head of your bed about 6 inches (15 cm). You can use a wedge to do this.  Avoid bending over if this makes your symptoms worse.  Keep all follow-up visits. This is important. Contact a health care provider if:  You have: ? New symptoms. ? Unexplained weight loss. ? Difficulty swallowing or it hurts to swallow. ? Wheezing or a  persistent cough. ? A hoarse voice.  Your symptoms do not improve with treatment. Get help right away if:  You have sudden pain in your arms, neck, jaw, teeth, or back.  You suddenly feel sweaty, dizzy, or light-headed.  You have chest pain or shortness of breath.  You vomit and the vomit is green, yellow, or black, or it looks like blood or coffee grounds.  You faint.  You have stool that is red, bloody, or black.  You cannot swallow, drink, or eat. These symptoms may represent a serious problem that is an emergency. Do not wait to see if the symptoms will go away. Get medical help right away. Call your local emergency services (911 in the [place] Do not drive yourself to the hospital. Summary  Gastroesophageal reflux happens when acid from the stomach flows up into the esophagus. GERD is a disease in which the reflux happens often, causes frequent or severe symptoms, or causes problems such as damage to the esophagus.  Treatment for this condition may vary depending on how severe your symptoms are. Your health care provider may recommend diet and lifestyle changes, medicine, or surgery.  Contact a health care provider if you  have new or worsening symptoms.  Take over-the-counter and prescription medicines only as told by your health care provider. Do not take aspirin, ibuprofen, or other NSAIDs unless your health care provider told you to do so.  Keep all follow-up visits as told by your health care provider. This is important. This information is not intended to replace advice given to you by your health care provider. Make sure you discuss any questions you have with your health care provider. Document Revised: 01/18/2020 Document Reviewed: 01/18/2020 Elsevier Patient Education  2021 [employer].

## 2020-11-21 NOTE — Assessment & Plan Note (Addendum)
Neg UA, neg Upreg, normal wet prep  Vaginal exam normal but difficult given obesity.. no lesions,no vaginal/uterine prolapse  Most likely focus of pain is uterine.  Pt does not have insurance so will try to target testing if able.  Start with pelvic US to eval.

## 2020-11-23 ENCOUNTER — Other Ambulatory Visit: Payer: Self-pay

## 2020-11-23 ENCOUNTER — Ambulatory Visit (HOSPITAL_BASED_OUTPATIENT_CLINIC_OR_DEPARTMENT_OTHER)
Admission: RE | Admit: 2020-11-23 | Discharge: 2020-11-23 | Disposition: A | Discharge status: Home / Self Care | Payer: Self-pay | Source: Ambulatory Visit | Attending: Family Medicine | Admitting: Family Medicine

## 2020-11-23 DIAGNOSIS — R1031 Right lower quadrant pain: Secondary | ICD-10-CM | POA: Insufficient documentation

## 2020-11-23 DIAGNOSIS — R102 Pelvic and perineal pain: Secondary | ICD-10-CM | POA: Insufficient documentation

## 2020-11-23 DIAGNOSIS — R1032 Left lower quadrant pain: Secondary | ICD-10-CM | POA: Insufficient documentation

## 2021-01-18 ENCOUNTER — Telehealth: Payer: Self-pay | Admitting: Primary Care

## 2021-01-18 NOTE — Telephone Encounter (Addendum)
Please notify patient that she needs repeat pelvic/transvaginal ultrasound to reassess the findings from early May 2022.  Let me know if she is agreeable and I'll place the order. [place] or [place]?

## 2021-01-18 NOTE — Telephone Encounter (Signed)
Left message to return call to our office.  

## 2021-01-19 NOTE — Telephone Encounter (Signed)
Left message to return call to our office.  

## 2021-01-25 NOTE — Telephone Encounter (Signed)
Send letter

## 2021-01-25 NOTE — Telephone Encounter (Signed)
Letter mailed

## 2021-01-25 NOTE — Telephone Encounter (Signed)
Called cell and l/m to call office. Work number did not answer and voice mail not set up.

## 2021-02-07 ENCOUNTER — Other Ambulatory Visit: Payer: Self-pay

## 2021-02-07 ENCOUNTER — Telehealth (INDEPENDENT_AMBULATORY_CARE_PROVIDER_SITE_OTHER): Payer: Self-pay | Admitting: Family Medicine

## 2021-02-07 ENCOUNTER — Encounter: Payer: Self-pay | Admitting: Family Medicine

## 2021-02-07 VITALS — Ht 63.0 in | Wt 242.0 lb

## 2021-02-07 DIAGNOSIS — H6990 Unspecified Eustachian tube disorder, unspecified ear: Secondary | ICD-10-CM

## 2021-02-07 DIAGNOSIS — H6980 Other specified disorders of Eustachian tube, unspecified ear: Secondary | ICD-10-CM

## 2021-02-07 DIAGNOSIS — H7101 Cholesteatoma of attic, right ear: Secondary | ICD-10-CM

## 2021-02-07 NOTE — Progress Notes (Signed)
Virtual visit completed through WebEx or similar program Patient location: home  Provider location: [place] at [facility], office  Participants: Patient and me (unless stated otherwise below)  Pandemic considerations d/w pt.   Limitations and rationale for visit method d/w patient.  Patient agreed to proceed.   CC: ear symptoms  HPI: sx started 1 week ago.  Fever initially.  Neck pain.  Temp up to 102.2.  no cough, no vomiting.    Fever broke in the last few days.  Then had R sided LA, along cervical chain.  No fevers in the last few days.    H/o cholesteatoma.  H/o dec R ear hearing at baseline but worse than normal recently.  She has R ETD since prev ear surgery.  Had a neg covid test.  No facial pain.  R ear is more muffled.    Meds and allergies reviewed.   ROS: Per HPI unless specifically indicated in ROS section   NAD Speech wnl  A/P: Likely with ongoing eustachian tube dysfunction.  We talked about options and anatomy. Reasonable to try flonase 2 sprays per nostril daily, try valsalva, monitor LA and f/u prn.  I would not expect her to have an ongoing acute infectious otitis in the absence of a fever at this point.  She agrees with plan.  History of cholesteatoma.  She is going to try to follow-up with ENT.  I told her I would check with social work to see if there are options to help this patient get insurance/see ENT (Dr. Pollyann Kennedy).

## 2021-02-08 DIAGNOSIS — H6990 Unspecified Eustachian tube disorder, unspecified ear: Secondary | ICD-10-CM | POA: Insufficient documentation

## 2021-02-08 DIAGNOSIS — H6980 Other specified disorders of Eustachian tube, unspecified ear: Secondary | ICD-10-CM | POA: Insufficient documentation

## 2021-02-08 HISTORY — DX: Unspecified eustachian tube disorder, unspecified ear: H69.90

## 2021-02-08 NOTE — Assessment & Plan Note (Signed)
  Likely with ongoing eustachian tube dysfunction.  We talked about options and anatomy. Reasonable to try flonase 2 sprays per nostril daily, try valsalva, monitor LA and f/u prn.  I would not expect her to have an ongoing acute infectious otitis in the absence of a fever at this point.  She agrees with plan.  History of cholesteatoma.  She is going to try to follow-up with ENT.  I told her I would check with social work to see if there are options to help this patient get insurance/see ENT (Dr. Pollyann Kennedy).

## 2021-02-10 ENCOUNTER — Telehealth: Payer: Self-pay | Admitting: *Deleted

## 2021-02-13 ENCOUNTER — Telehealth: Payer: Self-pay | Admitting: *Deleted

## 2021-02-13 NOTE — Telephone Encounter (Signed)
   Telephone encounter was:  Unsuccessful.  02/13/2021 Name: Michelle [name] MRN: [id number] DOB: 01/19/1991  Unsuccessful outbound call made today to assist with:   insurance  Outreach Attempt:  2nd Attempt  A HIPAA compliant voice message was left requesting a return call.  Instructed patient to call back at   Instructed patient to call back at [phone]  at their earliest convenience. Yehuda Mao Greenauer -[facility] Guide , Embedded Care Coordination [facility], Care Management  [phone] [address] [place] , [place] [address] Email : [email or web address]

## 2021-02-14 ENCOUNTER — Telehealth: Payer: Self-pay | Admitting: *Deleted

## 2021-02-14 NOTE — Telephone Encounter (Signed)
   Telephone encounter was:  Unsuccessful.  02/14/2021 Name: Michelle [name] MRN: [id number] DOB: 1990-08-27  Unsuccessful outbound call made today to assist with:  Transportation Needs  and Food Insecurity  Outreach Attempt:  3rd Attempt.  Referral closed unable to contact patient.  A HIPAA compliant voice message was left requesting a return call.  Instructed patient to call back at   Instructed patient to call back at [phone]  at their earliest convenience  Alois Cliche -[facility] Guide , Embedded Care Coordination [facility], Care Management  [phone] [address] [place] , [place] [address] Email : [email or web address]

## 2021-02-15 ENCOUNTER — Telehealth: Payer: Self-pay | Admitting: *Deleted

## 2021-02-15 NOTE — Telephone Encounter (Signed)
   Telephone encounter was:  Unsuccessful.  02/15/2021 Name: Michelle [name] MRN: [id number] DOB: [birth year]  Unsuccessful outbound call made today to assist with:   insurance   Outreach Attempt:  3rd Attempt.  Referral closed unable to contact patient.  A HIPAA compliant voice message was left requesting a return call.  Instructed patient to call back at   Instructed patient to call back at [phone]  at their earliest convenience.  Alois Cliche -[facility] Guide , Embedded Care Coordination [facility], Care Management  [phone] [address] [place] , [place] [address] Email : [email or web address]

## 2021-05-03 ENCOUNTER — Ambulatory Visit (INDEPENDENT_AMBULATORY_CARE_PROVIDER_SITE_OTHER): Payer: Self-pay

## 2021-05-03 ENCOUNTER — Other Ambulatory Visit: Payer: Self-pay

## 2021-05-03 ENCOUNTER — Ambulatory Visit
Admission: EM | Admit: 2021-05-03 | Discharge: 2021-05-03 | Disposition: A | Discharge status: Home / Self Care | Payer: Self-pay | Attending: Internal Medicine | Admitting: Internal Medicine

## 2021-05-03 DIAGNOSIS — M25511 Pain in right shoulder: Secondary | ICD-10-CM

## 2021-05-03 MED ORDER — IBUPROFEN 600 MG PO TABS: 600.0000 mg | ORAL_TABLET | Freq: Four times a day (QID) | ORAL | 0 refills | Status: DC | PRN

## 2021-05-03 NOTE — Discharge Instructions (Signed)
Your x-ray was negative.  You have been prescribed ibuprofen to treat pain and inflammation.  Please also alternate ice and heat to the area of pain.  Follow-up with provided contact information for orthopedist if pain persists over the next 1.5 to 2 weeks.

## 2021-05-03 NOTE — ED Triage Notes (Signed)
Pt states reaching for something at work yesterday and felt a pop in rt shoulder. States pain to rt shoulder started last night. States pain radiates up to neck.

## 2021-05-03 NOTE — ED Provider Notes (Signed)
[facility]    CSN: [id number] Arrival date & time: 05/03/21  1140      History   Chief Complaint Chief Complaint  Patient presents with   Shoulder Injury    HPI Michelle [name] is a 30 y.o. female.   Patient presents with right shoulder pain that started yesterday after an injury to the shoulder.  Patient states that she reached in front of her to grab something and felt a "pop" in the shoulder area.  Pain was not present at the time of injury.  Pain started last night and has been constant.  Pain radiates up neck and throughout right upper extremity.  Does endorse some numbness and tingling throughout right upper extremity.  Has full range of motion but with pain.  Denies any prior injuries to that shoulder.  Has used icy hot with minimal improvement in symptoms.   Shoulder Injury   Past Medical History:  Diagnosis Date   Asthma    Bipolar disorder (manic depression) (HCC)    Dysmenorrhea    Elevated testosterone level in female    Hypertriglyceridemia    Insulin resistance    Migraines     Patient Active Problem List   Diagnosis Date Noted   ETD (eustachian tube dysfunction) 02/08/2021   Bilateral lower abdominal cramping 11/21/2020   Gastroesophageal reflux disease without esophagitis 11/21/2020   Hidradenitis suppurativa 07/04/2020   Cholesteatoma of attic of right ear 08/06/2019   Conductive hearing loss of right ear with unrestricted hearing of left ear 08/06/2019   Asthma 08/05/2019   Headache, migraine 03/12/2016   Ruptured tympanic membrane 03/12/2016   Bipolar disorder, current episode mixed, moderate (HCC) 03/12/2016   Morbid obesity with BMI of 40.0-44.9, adult (HCC) 03/12/2016   Renal structural abnormality 03/12/2016    Past Surgical History:  Procedure Laterality Date   TYMPANOMASTOIDECTOMY WITH RECONSTRUCTION Right 09/21/2019   Procedure: TYMPANOMASTOIDECTOMY WITH RECONSTRUCTION;  Surgeon: Serena Colonel, MD;  Location: [facility];  Service: ENT;  Laterality: Right;   WISDOM TOOTH EXTRACTION      OB History     Gravida  2   Para  1   Term      Preterm      AB      Living         SAB      IAB      Ectopic      Multiple      Live Births               Home Medications    Prior to Admission medications   Medication Sig Start Date End Date Taking? Authorizing Provider  ibuprofen (ADVIL) 600 MG tablet Take 1 tablet (600 mg total) by mouth every 6 (six) hours as needed for mild pain. 05/03/21  Yes Lance Muss, FNP  albuterol (PROAIR HFA) 108 (90 Base) MCG/ACT inhaler Inhale 1-2 puffs into the lungs every 6 (six) hours as needed for wheezing or shortness of breath. 08/05/19   Doreene Nest, NP  etonogestrel (NEXPLANON) 68 MG IMPL implant Nexplanon 68 mg subdermal implant  Inject 1 implant by subcutaneous route.    [provider]  fluticasone (FLOVENT HFA) 110 MCG/ACT inhaler Inhale 1 puff into the lungs 2 (two) times daily. For asthma 08/05/19   Doreene Nest, NP    Family History Family History  Problem Relation Age of Onset   Gestational diabetes Mother    Hyperlipidemia Maternal Grandfather  Social History Social History   Tobacco Use   Smoking status: Every Day    Packs/day: 1.00    Types: Cigarettes   Smokeless tobacco: Never  Substance Use Topics   Alcohol use: No   Drug use: No     Allergies   Augmentin [amoxicillin-pot clavulanate] and Shrimp [shellfish allergy]   Review of Systems Review of Systems Per HPI  Physical Exam Triage Vital Signs ED Triage Vitals  Enc Vitals Group     BP 05/03/21 1230 (!) 142/87     Pulse Rate 05/03/21 1230 81     Resp 05/03/21 1230 18     Temp 05/03/21 1230 98.4 F (36.9 C)     Temp Source 05/03/21 1230 Oral     SpO2 05/03/21 1230 98 %     Weight --      Height --      Head Circumference --      Peak Flow --      Pain Score 05/03/21 1231 3     Pain Loc --      Pain Edu? --       Excl. in GC? --    No data found.  Updated Vital Signs BP (!) 142/87 (BP Location: Left Arm)   Pulse 81   Temp 98.4 F (36.9 C) (Oral)   Resp 18   SpO2 98%   Visual Acuity Right Eye Distance:   Left Eye Distance:   Bilateral Distance:    Right Eye Near:   Left Eye Near:    Bilateral Near:     Physical Exam Constitutional:      General: She is not in acute distress.    Appearance: Normal appearance. She is not toxic-appearing or diaphoretic.  HENT:     Head: Normocephalic and atraumatic.  Eyes:     Extraocular Movements: Extraocular movements intact.     Conjunctiva/sclera: Conjunctivae normal.  Pulmonary:     Effort: Pulmonary effort is normal.  Musculoskeletal:     Right shoulder: Tenderness present. No swelling, deformity, bony tenderness or crepitus. Normal strength. Normal pulse.     Left shoulder: Normal.     Right upper arm: Normal.     Left upper arm: Normal.     Right elbow: Normal.     Left elbow: Normal.     Right forearm: Normal.     Left forearm: Normal.     Right wrist: Normal.     Left wrist: Normal.     Right hand: Normal.     Left hand: Normal.     Comments: Tenderness to palpation to right trapezius muscle and pain generalized throughout right shoulder.  Neurovascular intact.  Has full range of motion but with pain on movement.  Strength 5/5.  Neurological:     General: No focal deficit present.     Mental Status: She is alert and oriented to person, place, and time. Mental status is at baseline.  Psychiatric:        Mood and Affect: Mood normal.        Behavior: Behavior normal.        Thought Content: Thought content normal.        Judgment: Judgment normal.     UC Treatments / Results  Labs (all labs ordered are listed, but only abnormal results are displayed) Labs Reviewed - No data to display  EKG   Radiology DG Shoulder Right  Result Date: 05/03/2021 CLINICAL DATA:  Shoulder injury. EXAM: RIGHT SHOULDER -  2+ VIEW COMPARISON:   None. FINDINGS: There is no evidence of fracture or dislocation. There is no evidence of arthropathy or other focal bone abnormality. Soft tissues are unremarkable. IMPRESSION: Negative. Electronically Signed   By: Signa Kell M.D.   On: 05/03/2021 13:31    Procedures Procedures (including critical care time)  Medications Ordered in UC Medications - No data to display  Initial Impression / Assessment and Plan / UC Course  I have reviewed the triage vital signs and the nursing notes.  Pertinent labs & imaging results that were available during my care of the patient were reviewed by me and considered in my medical decision making (see chart for details).     X-ray right shoulder was negative for any acute bony abnormality.  Physical exam showed tenderness on palpation to right trapezius muscle.  Suspect right trapezius muscle strain.  Although, patient was having generalized pain to the right shoulder.  Will treat with ibuprofen to decrease inflammation and pain as well as alternating ice and heat application.  Patient was provided with contact information for orthopedist to follow-up if pain persists.  No red flags are seen on exam.Discussed strict return precautions. Patient verbalized understanding and is agreeable with plan.  Final Clinical Impressions(s) / UC Diagnoses   Final diagnoses:  Acute pain of right shoulder     Discharge Instructions      Your x-ray was negative.  You have been prescribed ibuprofen to treat pain and inflammation.  Please also alternate ice and heat to the area of pain.  Follow-up with provided contact information for orthopedist if pain persists over the next 1.5 to 2 weeks.     ED Prescriptions     Medication Sig Dispense Auth. Provider   ibuprofen (ADVIL) 600 MG tablet Take 1 tablet (600 mg total) by mouth every 6 (six) hours as needed for mild pain. 30 tablet Lance Muss, FNP      PDMP not reviewed this encounter.   Lance Muss,  FNP 05/03/21 1346

## 2021-05-25 ENCOUNTER — Telehealth: Payer: Self-pay | Admitting: Primary Care

## 2021-05-25 ENCOUNTER — Other Ambulatory Visit: Payer: Self-pay

## 2021-05-26 ENCOUNTER — Encounter: Payer: Self-pay | Admitting: Primary Care

## 2021-05-26 ENCOUNTER — Ambulatory Visit (INDEPENDENT_AMBULATORY_CARE_PROVIDER_SITE_OTHER): Payer: Self-pay | Admitting: Primary Care

## 2021-05-26 ENCOUNTER — Other Ambulatory Visit: Payer: Self-pay

## 2021-05-26 VITALS — BP 130/82 | HR 88 | Temp 97.8°F | Ht 63.0 in | Wt 253.0 lb

## 2021-05-26 DIAGNOSIS — M549 Dorsalgia, unspecified: Secondary | ICD-10-CM | POA: Insufficient documentation

## 2021-05-26 DIAGNOSIS — R300 Dysuria: Secondary | ICD-10-CM

## 2021-05-26 DIAGNOSIS — M545 Low back pain, unspecified: Secondary | ICD-10-CM

## 2021-05-26 DIAGNOSIS — Q639 Congenital malformation of kidney, unspecified: Secondary | ICD-10-CM

## 2021-05-26 LAB — COMPREHENSIVE METABOLIC PANEL
ALT: 34 U/L (ref 0–35)
AST: 23 U/L (ref 0–37)
Albumin: 4.1 g/dL (ref 3.5–5.2)
Alkaline Phosphatase: 58 U/L (ref 39–117)
BUN: 12 mg/dL (ref 6–23)
CO2: 27 mEq/L (ref 19–32)
Calcium: 8.9 mg/dL (ref 8.4–10.5)
Chloride: 107 mEq/L (ref 96–112)
Creatinine, Ser: 0.83 mg/dL (ref 0.40–1.20)
GFR: 94.6 mL/min (ref >60.00)
Glucose, Bld: 89 mg/dL (ref 70–99)
Potassium: 4.2 mEq/L (ref 3.5–5.1)
Sodium: 138 mEq/L (ref 135–145)
Total Bilirubin: 0.3 mg/dL (ref 0.2–1.2)
Total Protein: 6.9 g/dL (ref 6.0–8.3)

## 2021-05-26 LAB — POC URINALSYSI DIPSTICK (AUTOMATED)
Bilirubin, UA: NEGATIVE
Blood, UA: NEGATIVE
Glucose, UA: NEGATIVE
Ketones, UA: NEGATIVE
Leukocytes, UA: NEGATIVE
Nitrite, UA: NEGATIVE
Protein, UA: POSITIVE — AB
Spec Grav, UA: 1.02 (ref 1.010–1.025)
Urobilinogen, UA: 0.2 E.U./dL
pH, UA: 6 (ref 5.0–8.0)

## 2021-05-26 NOTE — Assessment & Plan Note (Addendum)
Exam and HPI today consistent for MSK cause for symptoms.  UA today with trace protein, otherwise negative. No alarm signs on exam. Fortunately she is feeling better.  Discussed stretching, ice/heat. Continue Ibuprofen if needed.  Consider physical therapy.   We will obtain a renal ultrasound given her history of atrophic dysplastic right kidney, she agrees.

## 2021-05-26 NOTE — Assessment & Plan Note (Signed)
Reviewed CT scan from 2008.  Will obtain renal ultrasound to update imaging. Trace protein noted on UA. Will obtain renal function panel.

## 2021-05-26 NOTE — Progress Notes (Signed)
Subjective:    Patient ID: Michelle [name], female    DOB: 02-05-1991, 30 y.o.   MRN: [id number]  HPI  Michelle [name] is a very pleasant 30 y.o. female with a history of asthma, GERD, renal structural abnormality who presents today to discuss back pain.   Her pain is located to bilateral lower back from lower thoracic spine to mid lumbar spine that she first noticed three weeks ago when waking up from sleep.   She denies injury/trauma, radiation of pain, numbness/tingling, bowel/bladder changes.   She's taken Tylenol and Ibuprofen without improvement. She has taken a few AZO and cranberry pills with improvement. Today she's feeling much better than she did one week ago.   History of renal structural abnormality. CT scan from 2008 with atrophic, dysplastic right kidney with extensive scarring. She underwent ink testing for urine flow, she was told this was negative. She's had no follow up since.   She has an intermittent history of severe back pain spells, occurs every few years where she cannot move. 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    Review of Systems  Genitourinary:  Negative for dysuria, frequency, hematuria, pelvic pain and vaginal discharge.  Musculoskeletal:  Positive for back pain.  Neurological:  Negative for weakness and numbness.        Past Medical History:  Diagnosis Date   Asthma    Bipolar disorder (manic depression) (HCC)    Dysmenorrhea    Elevated testosterone level in female    Hypertriglyceridemia    Insulin resistance    Migraines     Social History   Socioeconomic History   Marital status: Single    Spouse name: Not on file   Number of children: Not on file   Years of education: Not on file   Highest education level: Not on file  Occupational History   Not on file  Tobacco Use   Smoking status: Every Day    Packs/day: 1.00    Types: Cigarettes   Smokeless tobacco: Never  Substance and Sexual Activity    Alcohol use: No   Drug use: No   Sexual activity: Yes    Birth control/protection: None  Other Topics Concern   Not on file  Social History Narrative   Single.   1 child.   Works as a Emergency planning/management officer.   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    Social Determinants of Health   Financial Resource Strain: Not on file  Food Insecurity: Not on file  Transportation Needs: Not on file  Physical Activity: Not on file  Stress: Not on file  Social Connections: Not on file  Intimate Partner Violence: Not on file    Past Surgical History:  Procedure Laterality Date   TYMPANOMASTOIDECTOMY WITH RECONSTRUCTION Right 09/21/2019   Procedure: TYMPANOMASTOIDECTOMY WITH RECONSTRUCTION;  Surgeon: Serena Colonel, MD;  Location: [facility];  Service: ENT;  Laterality: Right;   WISDOM TOOTH EXTRACTION      Family History  Problem Relation Age of Onset   Gestational diabetes Mother    Hyperlipidemia Maternal Grandfather     Allergies  Allergen Reactions   Augmentin [Amoxicillin-Pot Clavulanate] Hives   Shrimp [Shellfish Allergy]     Current Outpatient Medications on File Prior to Visit  Medication Sig Dispense Refill   albuterol (PROAIR HFA) 108 (90 Base) MCG/ACT inhaler Inhale 1-2 puffs into the lungs every 6 (six) hours as needed for wheezing or shortness of breath. 18  g 0   etonogestrel (NEXPLANON) 68 MG IMPL implant Nexplanon 68 mg subdermal implant  Inject 1 implant by subcutaneous route.     fluticasone (FLOVENT HFA) 110 MCG/ACT inhaler Inhale 1 puff into the lungs 2 (two) times daily. For asthma 1 Inhaler 2   ibuprofen (ADVIL) 600 MG tablet Take 1 tablet (600 mg total) by mouth every 6 (six) hours as needed for mild pain. 30 tablet 0   No current facility-administered medications on file prior to visit.    BP 130/82   Pulse 88   Temp 97.8 F (36.6 C) (Temporal)   Ht 5\' 3"  (1.6 m)   Wt 253 lb (114.8 kg)   SpO2 98%   BMI 44.82 kg/m  Objective:   Physical  Exam Cardiovascular:     Rate and Rhythm: Normal rate and regular rhythm.  Pulmonary:     Effort: Pulmonary effort is normal.  Abdominal:     Tenderness: There is no right CVA tenderness or left CVA tenderness.  Musculoskeletal:     Cervical back: Neck supple.     Lumbar back: No bony tenderness. Normal range of motion. Negative right straight leg raise test and negative left straight leg raise test.       Back:     Comments: Able to get up and down from exam table  Skin:    General: Skin is warm and dry.          Assessment & Plan:      This visit occurred during the SARS-CoV-2 public health emergency.  Safety protocols were in place, including screening questions prior to the visit, additional usage of staff PPE, and extensive cleaning of exam room while observing appropriate contact time as indicated for disinfecting solutions.

## 2021-05-26 NOTE — Patient Instructions (Addendum)
Stop by the lab prior to leaving today. I will notify you of your results once received.   You will be contacted regarding your kidney ultrasound.  Please let us know if you have not been contacted within two weeks.   It was a pleasure to see you today!        Family Planing   Our Department provides a later clinic on Tuesday evenings, with the last appointment time being 5:30 pm. Please call [phone] to schedule an appointment in either our [facility] or [facility] office.

## 2022-02-22 ENCOUNTER — Telehealth: Payer: Self-pay | Admitting: Primary Care

## 2022-02-22 ENCOUNTER — Ambulatory Visit (INDEPENDENT_AMBULATORY_CARE_PROVIDER_SITE_OTHER): Payer: Self-pay | Admitting: Nurse Practitioner

## 2022-02-22 VITALS — BP 118/72 | HR 97 | Temp 97.4°F | Resp 12 | Ht 63.0 in | Wt 266.5 lb

## 2022-02-22 DIAGNOSIS — H9391 Unspecified disorder of right ear: Secondary | ICD-10-CM

## 2022-02-22 NOTE — Telephone Encounter (Signed)
Will evaluate in office

## 2022-02-22 NOTE — Progress Notes (Signed)
   Acute Office Visit  Subjective:     Patient ID: Michelle [name], female    DOB: 23-Jan-1991, 31 y.o.   MRN: [id number]  Chief Complaint  Patient presents with   bleeding from right ear    Noticed blood coming from right ear last night, and today had blood on cotton ball tips. No pain.    HPI Patient is in today for Ear problems  States that she noticed blood coming from her right ear last night. Noticed it on a cotton ball today and no pain. States that it started to feel stuffy approx 4-6 weeks ago. States that she does clean her ear out freqently. States that she noticed the ear wax started smelling foul.   Had a "rocking chair noise" last night with movement of her external ear and noticed the blood. Decreased hearing at baseline. States that she has had nausea over the past few weeks.   Review of Systems  Constitutional:  Negative for chills and fever.  HENT:  Positive for ear discharge, hearing loss and sinus pain. Negative for ear pain and sore throat.   Gastrointestinal:  Positive for nausea.        Objective:    BP 118/72   Pulse 97   Temp (!) 97.4 F (36.3 C) (Temporal)   Resp 12   Ht 5\' 3"  (1.6 m)   Wt 266 lb 8 oz (120.9 kg)   SpO2 99%   BMI 47.21 kg/m    Physical Exam Vitals and nursing note reviewed.  Constitutional:      Appearance: Normal appearance. She is obese.  HENT:     Right Ear: Ear canal and external ear normal.     Left Ear: Tympanic membrane, ear canal and external ear normal.     Mouth/Throat:     Mouth: Mucous membranes are moist.     Pharynx: Oropharynx is clear.  Cardiovascular:     Rate and Rhythm: Normal rate and regular rhythm.     Heart sounds: Normal heart sounds.  Pulmonary:     Effort: Pulmonary effort is normal.     Breath sounds: Normal breath sounds.  Lymphadenopathy:     Cervical: No cervical adenopathy.  Neurological:     Mental Status: She is alert.     No results found for any visits on 02/22/22.       Assessment & Plan:   Problem List Items Addressed This Visit       Nervous and Auditory   Disorder of right ear - Primary    Patient had blood in right ear canal.  Nontender to palpation or pulling on the tragus or auricle.  TM intact in office.  Not a normal-looking TM but patient has had surgery on that side from ENT in [place].  We had a joint discussion about starting patient on ofloxacin or Augmentin to cover for possible ear infection.  At this juncture we will defer.  Gave her information for her previous ENT for follow-up for follow-up if she cannot get in we can try the antibiotic course to see if this is beneficial.  Low likelihood of benefit       No orders of the defined types were placed in this encounter.   Return if symptoms worsen or fail to improve.  [place], NP

## 2022-02-22 NOTE — Telephone Encounter (Signed)
Patient called in stating she thought she had an earache a few weeks ago. Her ear has been bleeding, to the point she has to sleep with a cotton ball in her ear because its leaks. Sent over to triage.

## 2022-02-22 NOTE — Telephone Encounter (Addendum)
I spoke with pt and she said few wks ago rt ear felt "stuffy" no pain. Last night when laying down felt tickle in rt ear and pt put her little finger in ear and brought back blood. Pt said blood was not streaming out of rt ear but oozing bright red blood and when pt got up this morning there was pea size of blood on cotton ball. Pt said still no earache, no fever noted. Pt said she noticed that her ear wax smelled like ammonia. Pt has decreased hearing from rt ear since had surgery 2 yrs ago on ear; pt does not have ins  and cannot afford to see specialist. Pt scheduled appt to see Audria Nine NP 02/22/22 at 2:40 with UC & ED precautions and pt voiced understanding and appreciative. Sending note to Audria Nine NP and Anastasiya CMA.    [facility] Day - Client TELEPHONE ADVICE RECORD Access[employer] Patient Name: Michelle [name] DS Gender: Female DOB: July 02, 1991 Age: 48 Y 2 M Return Phone Number: [phone] (Primary) Address: City/ State/ Zip: [place] [address] Client [facility] - Client Client Site [facility] - Day Provider Vernona Rieger - NP Contact Type Call Who Is Calling Patient / Member / Family / Caregiver Call Type Triage / Clinical Relationship To Patient Self Return Phone Number [phone] (Primary) Chief Complaint Ear Discharge Reason for Call Symptomatic / Request for Health Information Initial Comment Caller states that she has blood from her ear, she had surgery on this ear a few years ago to have a growth removed. She started having stuffiness in that ear 4-5 weeks ago, and last night she had some odd noises when she pressed on her ear. Translation No No Triage Reason Patient declined Nurse Assessment Nurse: Stefano Gaul, RN, Dwana Curd Date/Time Lamount Cohen Time): 02/22/2022 10:22:58 AM Confirm and document reason for call. If symptomatic, describe symptoms. ---Caller states she has blood coming from her  ear. states she already has spoken to someone in the office and has appt today at 2:45 pm. Does the patient have any new or worsening symptoms? ---Yes Will a triage be completed? ---No Select reason for no triage. ---Patient declined Please document clinical information provided and list any resource used. ---Pt states she has already spoken to someone in the office and has an appt at 2;45 pm today Disp. Time Lamount Cohen Time) Disposition Final User 02/22/2022 10:10:57 AM Attempt made - message left Quentin Cornwall 02/22/2022 10:25:38 AM Clinical Call Yes Stefano Gaul, RN, Dwana Curd Final Disposition 02/22/2022 10:25:38 AM Clinical Call Yes Stefano Gaul, RN, Ver

## 2022-02-22 NOTE — Patient Instructions (Signed)
Nice to see you today  ENT information Susy Frizzle, MD   [address]   [address]   [place], [place] [address]   [phone]    Let me know if you can get in. We can try antibiotics but again not sure that it will be helpful

## 2022-02-22 NOTE — Assessment & Plan Note (Signed)
Patient had blood in right ear canal.  Nontender to palpation or pulling on the tragus or auricle.  TM intact in office.  Not a normal-looking TM but patient has had surgery on that side from ENT in [place].  We had a joint discussion about starting patient on ofloxacin or Augmentin to cover for possible ear infection.  At this juncture we will defer.  Gave her information for her previous ENT for follow-up for follow-up if she cannot get in we can try the antibiotic course to see if this is beneficial.  Low likelihood of benefit

## 2022-05-17 ENCOUNTER — Ambulatory Visit: Payer: Self-pay | Admitting: Primary Care

## 2022-05-23 ENCOUNTER — Ambulatory Visit: Payer: Self-pay | Admitting: Primary Care

## 2022-10-11 ENCOUNTER — Encounter: Payer: Self-pay | Admitting: Primary Care

## 2022-10-11 ENCOUNTER — Ambulatory Visit (INDEPENDENT_AMBULATORY_CARE_PROVIDER_SITE_OTHER): Payer: Medicaid Other | Admitting: Primary Care

## 2022-10-11 VITALS — BP 150/90 | HR 103 | Temp 97.3°F | Ht 63.0 in | Wt 275.0 lb

## 2022-10-11 DIAGNOSIS — Z23 Encounter for immunization: Secondary | ICD-10-CM

## 2022-10-11 DIAGNOSIS — H7101 Cholesteatoma of attic, right ear: Secondary | ICD-10-CM | POA: Diagnosis not present

## 2022-10-11 DIAGNOSIS — G43909 Migraine, unspecified, not intractable, without status migrainosus: Secondary | ICD-10-CM

## 2022-10-11 DIAGNOSIS — F3162 Bipolar disorder, current episode mixed, moderate: Secondary | ICD-10-CM

## 2022-10-11 DIAGNOSIS — Z3046 Encounter for surveillance of implantable subdermal contraceptive: Secondary | ICD-10-CM

## 2022-10-11 DIAGNOSIS — Z6841 Body Mass Index (BMI) 40.0 and over, adult: Secondary | ICD-10-CM

## 2022-10-11 DIAGNOSIS — J4540 Moderate persistent asthma, uncomplicated: Secondary | ICD-10-CM | POA: Diagnosis not present

## 2022-10-11 DIAGNOSIS — Z0000 Encounter for general adult medical examination without abnormal findings: Secondary | ICD-10-CM

## 2022-10-11 LAB — CBC
HCT: 43.1 % (ref 36.0–46.0)
Hemoglobin: 14.5 g/dL (ref 12.0–15.0)
MCHC: 33.5 g/dL (ref 30.0–36.0)
MCV: 85.6 fl (ref 78.0–100.0)
Platelets: 297 10*3/uL (ref 150.0–400.0)
RBC: 5.03 Mil/uL (ref 3.87–5.11)
RDW: 13.7 % (ref 11.5–15.5)
WBC: 6.3 10*3/uL (ref 4.0–10.5)

## 2022-10-11 LAB — LIPID PANEL
Cholesterol: 166 mg/dL (ref 0–200)
HDL: 37.8 mg/dL — ABNORMAL LOW (ref >39.00)
LDL Cholesterol: 100 mg/dL — ABNORMAL HIGH (ref 0–99)
NonHDL: 127.7
Total CHOL/HDL Ratio: 4
Triglycerides: 140 mg/dL (ref 0.0–149.0)
VLDL: 28 mg/dL (ref 0.0–40.0)

## 2022-10-11 LAB — COMPREHENSIVE METABOLIC PANEL
ALT: 34 U/L (ref 0–35)
AST: 21 U/L (ref 0–37)
Albumin: 4.3 g/dL (ref 3.5–5.2)
Alkaline Phosphatase: 68 U/L (ref 39–117)
BUN: 11 mg/dL (ref 6–23)
CO2: 26 mEq/L (ref 19–32)
Calcium: 9.4 mg/dL (ref 8.4–10.5)
Chloride: 106 mEq/L (ref 96–112)
Creatinine, Ser: 0.95 mg/dL (ref 0.40–1.20)
GFR: 79.67 mL/min (ref >60.00)
Glucose, Bld: 96 mg/dL (ref 70–99)
Potassium: 4.3 mEq/L (ref 3.5–5.1)
Sodium: 137 mEq/L (ref 135–145)
Total Bilirubin: 0.4 mg/dL (ref 0.2–1.2)
Total Protein: 7.1 g/dL (ref 6.0–8.3)

## 2022-10-11 LAB — TSH: TSH: 3.18 u[IU]/mL (ref 0.35–5.50)

## 2022-10-11 LAB — HEMOGLOBIN A1C: Hgb A1c MFr Bld: 5.7 % (ref 4.6–6.5)

## 2022-10-11 NOTE — Progress Notes (Signed)
Subjective:    Patient ID: Michelle [name], female    DOB: [birth year], 32 y.o.   MRN: [id number]  HPI  Michelle [name] is a very pleasant 32 y.o. female who presents today for complete physical and follow up of chronic conditions.  Immunizations: -Tetanus: Due today  Diet: Fair diet.  Exercise: No regular exercise.  Eye exam: Completed several years ago Dental exam: Completed several years ago  Pap Smear: Due. She is wanting a referral for GYN.   BP Readings from Last 3 Encounters:  10/11/22 (!) 142/96  02/22/22 118/72  05/26/21 130/82      Review of Systems  Constitutional:  Negative for unexpected weight change.  HENT:  Negative for rhinorrhea.   Respiratory:  Positive for cough. Negative for shortness of breath.   Cardiovascular:  Negative for chest pain.  Gastrointestinal:  Negative for constipation and diarrhea.  Genitourinary:  Positive for menstrual problem. Negative for difficulty urinating.  Musculoskeletal:  Negative for arthralgias.  Skin:  Negative for rash.  Allergic/Immunologic: Negative for environmental allergies.  Neurological:  Negative for dizziness and headaches.  Psychiatric/Behavioral:  The patient is not nervous/anxious.          Past Medical History:  Diagnosis Date   Asthma    Bilateral lower abdominal cramping 11/21/2020   Bipolar disorder (manic depression) (HCC)    Dysmenorrhea    Elevated testosterone level in female    ETD (eustachian tube dysfunction) 02/08/2021   Hypertriglyceridemia    Insulin resistance    Migraines    Ruptured tympanic membrane 03/12/2016    Social History   Socioeconomic History   Marital status: Single    Spouse name: Not on file   Number of children: Not on file   Years of education: Not on file   Highest education level: Not on file  Occupational History   Not on file  Tobacco Use   Smoking status: Every Day    Types: E-cigarettes   Smokeless tobacco: Never  Substance and Sexual Activity    Alcohol use: No   Drug use: No   Sexual activity: Yes    Birth control/protection: None  Other Topics Concern   Not on file  Social History Narrative   Single.   1 child.   Works as a Government social research officer.   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    Social Determinants of Health   Financial Resource Strain: Not on file  Food Insecurity: Not on file  Transportation Needs: Not on file  Physical Activity: Not on file  Stress: Not on file  Social Connections: Not on file  Intimate Partner Violence: Not on file    Past Surgical History:  Procedure Laterality Date   TYMPANOMASTOIDECTOMY WITH RECONSTRUCTION Right 09/21/2019   Procedure: TYMPANOMASTOIDECTOMY WITH RECONSTRUCTION;  Surgeon: Izora Gala, MD;  Location: [facility];  Service: ENT;  Laterality: Right;   WISDOM TOOTH EXTRACTION      Family History  Problem Relation Age of Onset   Gestational diabetes Mother    Hyperlipidemia Maternal Grandfather     Allergies  Allergen Reactions   Augmentin [Amoxicillin-Pot Clavulanate] Hives   Shrimp [Shellfish Allergy]     Current Outpatient Medications on File Prior to Visit  Medication Sig Dispense Refill   albuterol (PROAIR HFA) 108 (90 Base) MCG/ACT inhaler Inhale 1-2 puffs into the lungs every 6 (six) hours as needed for wheezing or shortness of breath. 18 g 0   etonogestrel (NEXPLANON) 68 MG IMPL implant Nexplanon  68 mg subdermal implant  Inject 1 implant by subcutaneous route.     fluticasone (FLOVENT HFA) 110 MCG/ACT inhaler Inhale 1 puff into the lungs 2 (two) times daily. For asthma 1 Inhaler 2   ibuprofen (ADVIL) 600 MG tablet Take 1 tablet (600 mg total) by mouth every 6 (six) hours as needed for mild pain. 30 tablet 0   No current facility-administered medications on file prior to visit.    BP (!) 142/96   Pulse (!) 103   Temp (!) 97.3 F (36.3 C) (Temporal)   Ht 5\' 3"  (1.6 m)   Wt 275 lb (124.7 kg)   SpO2 99%   BMI 48.71 kg/m  Objective:   Physical  Exam HENT:     Right Ear: Tympanic membrane and ear canal normal.     Left Ear: Tympanic membrane and ear canal normal.     Nose: Nose normal.  Eyes:     Conjunctiva/sclera: Conjunctivae normal.     Pupils: Pupils are equal, round, and reactive to light.  Neck:     Thyroid: No thyromegaly.  Cardiovascular:     Rate and Rhythm: Normal rate and regular rhythm.     Heart sounds: No murmur heard. Pulmonary:     Effort: Pulmonary effort is normal.     Breath sounds: Normal breath sounds. No rales.  Abdominal:     General: Bowel sounds are normal.     Palpations: Abdomen is soft.     Tenderness: There is no abdominal tenderness.  Musculoskeletal:        General: Normal range of motion.     Cervical back: Neck supple.  Lymphadenopathy:     Cervical: No cervical adenopathy.  Skin:    General: Skin is warm and dry.     Findings: No rash.  Neurological:     Mental Status: She is alert and oriented to person, place, and time.     Cranial Nerves: No cranial nerve deficit.     Deep Tendon Reflexes: Reflexes are normal and symmetric.  Psychiatric:        Mood and Affect: Mood normal.           Assessment & Plan:  Preventative health care Assessment & Plan: Tetanus due, provided today. Pap smear overdue, offered today for which she declines as she will setup with GYN.   Discussed the importance of a healthy diet and regular exercise in order for weight loss, and to reduce the risk of further co-morbidity.  Exam stable. Labs pending.  Follow up in 1 year for repeat physical.    Nexplanon removal -     Ambulatory referral to Obstetrics / Gynecology  Moderate persistent asthma without complication Assessment & Plan: Inappropriate use of SABA but has not used Flovent.   Resume Flovent 110 mcg BID. Continue albuterol inhaler PRN.   Continue to monitor.    Migraine without status migrainosus, not intractable, unspecified migraine type Assessment & Plan: Infrequent.    Continue to monitor.    Cholesteatoma of attic of right ear Assessment & Plan: Following with ENT.  Would like to be referred to a different ENT for continued issues. Referral placed.   Orders: -     Ambulatory referral to ENT  Bipolar disorder, current episode mixed, moderate ([place]) Assessment & Plan: Not currently following with psychiatry due to loss of insurance.  Overall she feels that her symptoms are controlled.  Continue to monitor.  Consider psychiatry in the future. She will update if  needed.    Class 3 severe obesity due to excess calories without serious comorbidity with body mass index (BMI) of 45.0 to 49.9 in adult [facility]) Assessment & Plan: Offered nutritionist referral, she declines as she may be seeing a nutritionist at [facility] in the future.  Labs pending.  Orders: -     Lipid panel -     Hemoglobin A1c -     Comprehensive metabolic panel -     CBC -     TSH        Pleas Koch, NP

## 2022-10-11 NOTE — Assessment & Plan Note (Signed)
Inappropriate use of SABA but has not used Flovent.   Resume Flovent 110 mcg BID. Continue albuterol inhaler PRN.   Continue to monitor.

## 2022-10-11 NOTE — Assessment & Plan Note (Signed)
Infrequent.   Continue to monitor.  

## 2022-10-11 NOTE — Assessment & Plan Note (Signed)
Tetanus due, provided today. Pap smear overdue, offered today for which she declines as she will setup with GYN.   Discussed the importance of a healthy diet and regular exercise in order for weight loss, and to reduce the risk of further co-morbidity.  Exam stable. Labs pending.  Follow up in 1 year for repeat physical.

## 2022-10-11 NOTE — Assessment & Plan Note (Signed)
Not currently following with psychiatry due to loss of insurance.  Overall she feels that her symptoms are controlled.  Continue to monitor.  Consider psychiatry in the future. She will update if needed.

## 2022-10-11 NOTE — Assessment & Plan Note (Signed)
Offered nutritionist referral, she declines as she may be seeing a nutritionist at [facility] in the future.  Labs pending.

## 2022-10-11 NOTE — Patient Instructions (Signed)
Stop by the lab prior to leaving today. I will notify you of your results once received.   You will either be contacted via phone regarding your referral to ENT and GYN, or you may receive a letter on your MyChart portal from our referral team with instructions for scheduling an appointment. Please let us know if you have not been contacted by anyone within two weeks.  It was a pleasure to see you today!

## 2022-10-11 NOTE — Assessment & Plan Note (Signed)
Following with ENT.  Would like to be referred to a different ENT for continued issues. Referral placed.

## 2022-10-24 ENCOUNTER — Telehealth: Payer: Self-pay | Admitting: Primary Care

## 2022-10-24 DIAGNOSIS — H7101 Cholesteatoma of attic, right ear: Secondary | ICD-10-CM

## 2022-10-24 NOTE — Telephone Encounter (Signed)
Noted, referral placed.  

## 2022-10-24 NOTE — Telephone Encounter (Signed)
   Reason for Referral Request:   Has patient been seen PCP for this complaint? Yes, at [facility] on 3/21  No,  please schedule patient for appointment for complaint.  Patient scheduled on:   Yes, please find out following information.  Referral for which specialty: ENT  Preferred office/provider: Dr. Suzan Slick [facility]   Patient requested the wrong office for referral, listed above is where she would now like a referral to.

## 2022-11-02 DIAGNOSIS — H7101 Cholesteatoma of attic, right ear: Secondary | ICD-10-CM | POA: Diagnosis not present

## 2022-11-02 DIAGNOSIS — H9011 Conductive hearing loss, unilateral, right ear, with unrestricted hearing on the contralateral side: Secondary | ICD-10-CM | POA: Diagnosis not present

## 2022-12-13 ENCOUNTER — Telehealth: Payer: Medicaid Other | Admitting: Family Medicine

## 2022-12-13 ENCOUNTER — Encounter: Payer: Self-pay | Admitting: Family Medicine

## 2022-12-13 VITALS — Temp 99.5°F | Ht 63.0 in | Wt 270.0 lb

## 2022-12-13 DIAGNOSIS — J0100 Acute maxillary sinusitis, unspecified: Secondary | ICD-10-CM

## 2022-12-13 MED ORDER — DOXYCYCLINE HYCLATE 100 MG PO TABS: 100.0000 mg | ORAL_TABLET | Freq: Two times a day (BID) | ORAL | 0 refills | Status: DC

## 2022-12-13 NOTE — Progress Notes (Signed)
Virtual visit completed through WebEx or similar program Patient location: home  Provider location: [place] [facility], office  Participants: Patient and me (unless stated otherwise below)  Limitations and rationale for visit method d/w patient.  Patient agreed to proceed.   CC: f/u re: sinus sx.    HPI:  H/o mastoidectomy per ENT. Chloramphenicol used prev for R ear drainage.  She had HA with BID dosing.  So she cut back gradually, down to once per week.  That generally helps with ear drainage.  She can have drainage w/o fever.    Separate issue with other symptom.  Started with L sided facial pain, maxillary are, about 10 days.  Prev with fever up to 102.4, over the last 3 days.  Had swollen glands in neck area, body aches when she has a fever/chills, stuffed up nose, sore throat, pain across face and down jaw. Taking tylenol, dayquil and nyquil. Can still take a deep breath.    Work note done for patient.    Meds and allergies reviewed.   ROS: Per HPI unless specifically indicated in ROS section   NAD Speech wnl L maxillary area ttp during the call.    A/P:  Maxillary sinusitis, presumed.  Start doxy, supportive care. Rest and fluids.  Sunburn caution d/w pt.  She agrees with plan.

## 2022-12-17 DIAGNOSIS — J0100 Acute maxillary sinusitis, unspecified: Secondary | ICD-10-CM | POA: Insufficient documentation

## 2022-12-17 NOTE — Assessment & Plan Note (Signed)
Maxillary sinusitis, presumed.  Start doxy, supportive care. Rest and fluids.  Sunburn caution d/w pt.  She agrees with plan.

## 2022-12-20 ENCOUNTER — Other Ambulatory Visit (HOSPITAL_COMMUNITY)
Admission: RE | Admit: 2022-12-20 | Discharge: 2022-12-20 | Disposition: A | Discharge status: Home / Self Care | Payer: Medicaid Other | Source: Ambulatory Visit | Attending: Advanced Practice Midwife | Admitting: Advanced Practice Midwife

## 2022-12-20 ENCOUNTER — Telehealth (INDEPENDENT_AMBULATORY_CARE_PROVIDER_SITE_OTHER): Payer: Medicaid Other | Admitting: Primary Care

## 2022-12-20 ENCOUNTER — Ambulatory Visit: Payer: Medicaid Other | Admitting: Advanced Practice Midwife

## 2022-12-20 ENCOUNTER — Encounter: Payer: Self-pay | Admitting: Primary Care

## 2022-12-20 ENCOUNTER — Encounter: Payer: Self-pay | Admitting: Advanced Practice Midwife

## 2022-12-20 VITALS — Ht 63.0 in | Wt 271.0 lb

## 2022-12-20 VITALS — BP 149/85 | HR 96 | Ht 63.0 in | Wt 275.0 lb

## 2022-12-20 DIAGNOSIS — Z9889 Other specified postprocedural states: Secondary | ICD-10-CM

## 2022-12-20 DIAGNOSIS — F419 Anxiety disorder, unspecified: Secondary | ICD-10-CM

## 2022-12-20 DIAGNOSIS — Z87898 Personal history of other specified conditions: Secondary | ICD-10-CM | POA: Diagnosis not present

## 2022-12-20 DIAGNOSIS — Z8742 Personal history of other diseases of the female genital tract: Secondary | ICD-10-CM | POA: Diagnosis not present

## 2022-12-20 DIAGNOSIS — Z1339 Encounter for screening examination for other mental health and behavioral disorders: Secondary | ICD-10-CM

## 2022-12-20 DIAGNOSIS — Z124 Encounter for screening for malignant neoplasm of cervix: Secondary | ICD-10-CM

## 2022-12-20 DIAGNOSIS — Z3046 Encounter for surveillance of implantable subdermal contraceptive: Secondary | ICD-10-CM

## 2022-12-20 DIAGNOSIS — Z01419 Encounter for gynecological examination (general) (routine) without abnormal findings: Secondary | ICD-10-CM | POA: Diagnosis not present

## 2022-12-20 DIAGNOSIS — F3162 Bipolar disorder, current episode mixed, moderate: Secondary | ICD-10-CM

## 2022-12-20 MED ORDER — ESCITALOPRAM OXALATE 10 MG PO TABS: 10.0000 mg | ORAL_TABLET | Freq: Every day | ORAL | 0 refills | Status: DC

## 2022-12-20 MED ORDER — TRAZODONE HCL 50 MG PO TABS: 50.0000 mg | ORAL_TABLET | Freq: Every day | ORAL | 0 refills | Status: DC

## 2022-12-20 NOTE — Progress Notes (Signed)
GYNECOLOGY ANNUAL PREVENTATIVE CARE ENCOUNTER NOTE  History:     Michelle [name] is a 32 y.o. G2P1 female here for a routine annual gynecologic exam.  Current complaints: recurrent hemorrhagic ovarian cysts. Patient states the first occurrence coincided with an allergic reaction. She had a syncopal event and EMS was called. Subsequent episodes are less intense and she states she can feel them coming on. Denies abnormal vaginal bleeding, discharge, pelvic pain, problems with intercourse or other gynecologic concerns.   Patient endorses history of breast tenderness and superficial bumps along her nipple line.  She is without complaint today. She states she has attempted breast self exams but doesn't know what she should be looking for, when to be concerned, etc.  Patient requests Nexplanon removal. She states it was placed prior to 2019. She is abstinent. Patient lives with her 71 year old daughter.   Patient has a PCP and was seen there this morning.   Gynecologic History No LMP recorded. Patient has had an implant. Contraception: abstinence, has expired Nexplanon in situ which was placed 2017 or 2019 Last Pap: Remote, also remote history of Colpo "at least 12 years ago".  Last Mammogram: N/A.   Last Colonoscopy: N/A.    Obstetric History OB History  Gravida Para Term Preterm AB Living  2 1       1   SAB IAB Ectopic Multiple Live Births               # Outcome Date GA Lbr Len/2nd Weight Sex Delivery Anes PTL Lv  2 Para           1 Gravida             Past Medical History:  Diagnosis Date   Asthma    Bilateral lower abdominal cramping 11/21/2020   Bipolar disorder (manic depression) (HCC)    Dysmenorrhea    Elevated testosterone level in female    ETD (eustachian tube dysfunction) 02/08/2021   Hypertriglyceridemia    Insulin resistance    Migraines    Ruptured tympanic membrane 03/12/2016    Past Surgical History:  Procedure Laterality Date   TYMPANOMASTOIDECTOMY WITH  RECONSTRUCTION Right 09/21/2019   Procedure: TYMPANOMASTOIDECTOMY WITH RECONSTRUCTION;  Surgeon: Serena Colonel, MD;  Location: [facility];  Service: ENT;  Laterality: Right;   WISDOM TOOTH EXTRACTION      Current Outpatient Medications on File Prior to Visit  Medication Sig Dispense Refill   albuterol (PROAIR HFA) 108 (90 Base) MCG/ACT inhaler Inhale 1-2 puffs into the lungs every 6 (six) hours as needed for wheezing or shortness of breath. 18 g 0   Chloramphenicol POWD by Does not apply route. Used in the ear per ENT.     doxycycline (VIBRA-TABS) 100 MG tablet Take 1 tablet (100 mg total) by mouth 2 (two) times daily. 14 tablet 0   etonogestrel (NEXPLANON) 68 MG IMPL implant Nexplanon 68 mg subdermal implant  Inject 1 implant by subcutaneous route.     fluticasone (FLOVENT HFA) 110 MCG/ACT inhaler Inhale 1 puff into the lungs 2 (two) times daily. For asthma 1 Inhaler 2   No current facility-administered medications on file prior to visit.    Allergies  Allergen Reactions   Augmentin [Amoxicillin-Pot Clavulanate] Hives   Shrimp [Shellfish Allergy]     Social History:  reports that she has been smoking e-cigarettes. She has never used smokeless tobacco. She reports that she does not drink alcohol and does not use drugs.  Family  History  Problem Relation Age of Onset   Gestational diabetes Mother    Hyperlipidemia Maternal Grandfather     The following portions of the patient's history were reviewed and updated as appropriate: allergies, current medications, past family history, past medical history, past social history, past surgical history and problem list.  Review of Systems Pertinent items noted in HPI and remainder of comprehensive ROS otherwise negative.  Physical Exam:  BP (!) 149/85   Pulse 96   Ht 5\' 3"  (1.6 m)   Wt 275 lb (124.7 kg)   BMI 48.71 kg/m  CONSTITUTIONAL: Well-developed, well-nourished female in no acute distress.  HENT:  Normocephalic,  atraumatic, External right and left ear normal.  EYES: Conjunctivae and EOM are normal. Pupils are equal, round, and reactive to light. No scleral icterus.  NECK: Normal range of motion, supple, no masses.  Normal thyroid.  SKIN: Skin is warm and dry. No rash noted. Not diaphoretic. No erythema. No pallor. MUSCULOSKELETAL: Normal range of motion. No tenderness.  No cyanosis, clubbing, or edema. NEUROLOGIC: Alert and oriented to person, place, and time. Normal reflexes, muscle tone coordination.  PSYCHIATRIC: Normal mood and affect. Normal behavior. Normal judgment and thought content. CARDIOVASCULAR: Normal heart rate noted, regular rhythm RESPIRATORY: Clear to auscultation bilaterally. Effort and breath sounds normal, no problems with respiration noted. BREASTS: Symmetric in size. No masses, tenderness, skin changes, nipple drainage, or lymphadenopathy bilaterally. Well-established, healed nipple piercings. Performed in the presence of a chaperone. ABDOMEN: Soft, no distention noted.  No tenderness, rebound or guarding.  PELVIC: Normal appearing external genitalia and urethral meatus; normal appearing vaginal mucosa and cervix.  No abnormal vaginal discharge noted.  Pap smear obtained.  Normal uterine size, no other palpable masses, no uterine or adnexal tenderness.  Performed in the presence of a chaperone.   Assessment and Plan:    1. Well woman exam with routine gynecological exam --No concerning findings on physical exam  2. Encounter for Nexplanon removal - Has migrated from original placement - Palpable when patient is upright, more difficult to stabilize when reclined - Discussed with patient I can attempt but feel it would be irresponsible for me to attempt removal if I'm not 100% sure I can remove.  - Patient agreeable with my recommendation to return for MD Nexplanon removal  3. Screening for cervical cancer  - Cytology - PAP  4. History of breast problem - Not present today,  no family history of breast cancer - Large pendulous breasts, low threshold for breast ultrasound when/of complaints return - Discussed normal physiologic changes  related to caffeine intake  5. Hx of colposcopy with cervical biopsy - Remote. At least 12 years ago. - No documented pap since 2013  6. Hx of ovarian cyst   Patient to return for Nexplanon removal with MD   Will follow up results of pap smear and manage accordingly. Routine preventative health maintenance measures emphasized. Please refer to After Visit Summary for other counseling recommendations.      Clayton Bibles, MSA, MSN, CNM Certified Nurse Midwife, Biochemist, clinical for [employer], [facility]

## 2022-12-20 NOTE — Patient Instructions (Signed)
Start Lexapro 10  mg for anxiety and depression. Take 1/2 tablet by mouth once daily for about one week, then increase to 1 full tablet thereafter.   You may take Trazodone 50 mg at bedtime for sleep.   You will either be contacted via phone regarding your referral to psychiatry, or you may receive a letter on your MyChart portal from our referral team with instructions for scheduling an appointment. Please let us know if you have not been contacted by anyone within two weeks.  Please schedule a follow up visit for 1 month.  It was a pleasure to see you today!

## 2022-12-20 NOTE — Progress Notes (Signed)
Patient presents for Annual.  LMP: 07/2022 was very heavy and painful Last pap:  > 5 yrs Notes Hx of Abnormal Paps cannot recall results. Notes having Colposcopy in the past  Contraception:  Nexplanon inserted 2017/2018  Mammogram: Not yet indicated No Family Hx of Breast Cancer STD Screening: Declines Flu Vaccine : N/A  CC:  Breast Tenderness and bumps around Nipples on both Breast  Notes Hx of cyst has had U/S in the past.  Fun Fact: Patient loves being a mom.

## 2022-12-20 NOTE — Assessment & Plan Note (Signed)
Uncontrolled.  Referral placed to psychiatry.  Start Lexapro 10 mg daily.   Patient is to take 1/2 tablet daily for 8 days, then advance to 1 full tablet thereafter. We discussed possible side effects of headache, GI upset, drowsiness.  Start Trazodone 50 mg HS.  Follow up in 4 weeks for re-evaluation.

## 2022-12-20 NOTE — Progress Notes (Signed)
Patient ID: Michelle [name], female    DOB: 07-Jul-1991, 32 y.o.   MRN: [id number]  Virtual visit completed through [employer], a video enabled telemedicine application. Due to national recommendations of social distancing due to COVID-19, a virtual visit is felt to be most appropriate for this patient at this time. Reviewed limitations, risks, security and privacy concerns of performing a virtual visit and the availability of in person appointments. I also reviewed that there may be a patient responsible charge related to this service. The patient agreed to proceed.   Patient location: home Provider location: [place] at [facility], office Persons participating in this virtual visit: patient, provider   If any vitals were documented, they were collected by patient at home unless specified below.    Ht 5\' 3"  (1.6 m) Comment: per patient  Wt 271 lb (122.9 kg) Comment: per pt  BMI 48.01 kg/m    CC: Anxiety/Insomnia Subjective:   HPI: Michelle [name] is a 32 y.o. female with a history of bipolar disorder, class 3 obesity, asthma, migraines, tobacco use presenting on 12/20/2022 for Anxiety (New symptoms ) and Insomnia (Symptoms have increased in last year. )  History of Bipolar Disorder, previously following with psychiatry but she has not seen her psychiatrist in 10 years.   Symptoms include difficulty sleeping with mind racing thoughts, feeling anxious, difficulty focusing, forgetful about things, feeling down, over thinking things, thinking about death at while trying to get to sleep, worrying about minor things or things that she has no control over, irritability when things are not organized or clean. Symptoms get worse each year around her birthday.   She has difficulty falling asleep. She will lay awake for hours with mind racing thoughts. She will wake up feeling tired and with a sore jaw due to her jaw clenching during the night. She's tried taking Melatonin, smoking mariajuana, taking  zzzquil pills without improvement or with side effects. She has not used mariajuana in years.   She was once managed on Latuda, but she stopped taking after 9 months due to lack of insurance and feeling "numb" on the medication.       Relevant past medical, surgical, family and social history reviewed and updated as indicated. Interim medical history since our last visit reviewed. Allergies and medications reviewed and updated. Outpatient Medications Prior to Visit  Medication Sig Dispense Refill   albuterol (PROAIR HFA) 108 (90 Base) MCG/ACT inhaler Inhale 1-2 puffs into the lungs every 6 (six) hours as needed for wheezing or shortness of breath. 18 g 0   Chloramphenicol POWD by Does not apply route. Used in the ear per ENT.     doxycycline (VIBRA-TABS) 100 MG tablet Take 1 tablet (100 mg total) by mouth 2 (two) times daily. 14 tablet 0   etonogestrel (NEXPLANON) 68 MG IMPL implant Nexplanon 68 mg subdermal implant  Inject 1 implant by subcutaneous route.     fluticasone (FLOVENT HFA) 110 MCG/ACT inhaler Inhale 1 puff into the lungs 2 (two) times daily. For asthma 1 Inhaler 2   No facility-administered medications prior to visit.     Per HPI unless specifically indicated in ROS section below Review of Systems  Constitutional:  Positive for fatigue. Negative for diaphoresis.  Respiratory:  Negative for shortness of breath.   Cardiovascular:  Negative for palpitations.  Psychiatric/Behavioral:  Positive for sleep disturbance. The patient is nervous/anxious.    Objective:  Ht 5\' 3"  (1.6 m) Comment: per patient  Wt 271 lb (  122.9 kg) Comment: per pt  BMI 48.01 kg/m   Wt Readings from Last 3 Encounters:  12/20/22 271 lb (122.9 kg)  12/13/22 270 lb (122.5 kg)  10/11/22 275 lb (124.7 kg)       Physical exam: General: Alert and oriented x 3, no distress, does not appear sickly  Pulmonary: Speaks in complete sentences without increased work of breathing, no cough during  visit.  Psychiatric: Thought content, and behavior normal. Does appear anxious.      Results for orders placed or performed in visit on 10/11/22  Lipid panel  Result Value Ref Range   Cholesterol 166 0 - 200 mg/dL   Triglycerides 161.0 0.0 - 149.0 mg/dL   HDL 96.04 (L) >54.09 mg/dL   VLDL 81.1 0.0 - 91.4 mg/dL   LDL Cholesterol 782 (H) 0 - 99 mg/dL   Total CHOL/HDL Ratio 4    NonHDL 127.70   Hemoglobin A1c  Result Value Ref Range   Hgb A1c MFr Bld 5.7 4.6 - 6.5 %  Comprehensive metabolic panel  Result Value Ref Range   Sodium 137 135 - 145 mEq/L   Potassium 4.3 3.5 - 5.1 mEq/L   Chloride 106 96 - 112 mEq/L   CO2 26 19 - 32 mEq/L   Glucose, Bld 96 70 - 99 mg/dL   BUN 11 6 - 23 mg/dL   Creatinine, Ser 9.56 0.40 - 1.20 mg/dL   Total Bilirubin 0.4 0.2 - 1.2 mg/dL   Alkaline Phosphatase 68 39 - 117 U/L   AST 21 0 - 37 U/L   ALT 34 0 - 35 U/L   Total Protein 7.1 6.0 - 8.3 g/dL   Albumin 4.3 3.5 - 5.2 g/dL   GFR 21.30 >86.57 mL/min   Calcium 9.4 8.4 - 10.5 mg/dL  CBC  Result Value Ref Range   WBC 6.3 4.0 - 10.5 K/uL   RBC 5.03 3.87 - 5.11 Mil/uL   Platelets 297.0 150.0 - 400.0 K/uL   Hemoglobin 14.5 12.0 - 15.0 g/dL   HCT 84.6 96.2 - 95.2 %   MCV 85.6 78.0 - 100.0 fl   MCHC 33.5 30.0 - 36.0 g/dL   RDW 84.1 32.4 - 40.1 %  TSH  Result Value Ref Range   TSH 3.18 0.35 - 5.50 uIU/mL   Assessment & Plan:   Problem List Items Addressed This Visit       Other   Bipolar disorder, current episode mixed, moderate (HCC)    Uncontrolled.  Referral placed to psychiatry.  Start Lexapro 10 mg daily.   Patient is to take 1/2 tablet daily for 8 days, then advance to 1 full tablet thereafter. We discussed possible side effects of headache, GI upset, drowsiness.  Start Trazodone 50 mg HS.  Follow up in 4 weeks for re-evaluation.        Anxiety and depression - Primary    Uncontrolled.  Referral placed to psychiatry.  Start Lexapro 10 mg daily.   Patient is to take  1/2 tablet daily for 8 days, then advance to 1 full tablet thereafter. We discussed possible side effects of headache, GI upset, drowsiness.  Start Trazodone 50 mg HS.  Follow up in 4 weeks for re-evaluation.        Relevant Medications   escitalopram (LEXAPRO) 10 MG tablet   traZODone (DESYREL) 50 MG tablet   Other Relevant Orders   Ambulatory referral to Psychiatry     Meds ordered this encounter  Medications   escitalopram (LEXAPRO)  10 MG tablet    Sig: Take 1 tablet (10 mg total) by mouth daily. for anxiety and depression.    Dispense:  90 tablet    Refill:  0    Order Specific Question:   Supervising Provider    Answer:   BEDSOLE, AMY E [[id number]]   traZODone (DESYREL) 50 MG tablet    Sig: Take 1 tablet (50 mg total) by mouth at bedtime. For sleep    Dispense:  90 tablet    Refill:  0    Order Specific Question:   Supervising Provider    Answer:   Ermalene Searing, AMY E [[id number]]   Orders Placed This Encounter  Procedures   Ambulatory referral to Psychiatry    Referral Priority:   Routine    Referral Type:   Psychiatric    Referral Reason:   Specialty Services Required    Requested Specialty:   Psychiatry    Number of Visits Requested:   1    I discussed the assessment and treatment plan with the patient. The patient was provided an opportunity to ask questions and all were answered. The patient agreed with the plan and demonstrated an understanding of the instructions. The patient was advised to call back or seek an in-person evaluation if the symptoms worsen or if the condition fails to improve as anticipated.  Follow up plan:   Start Lexapro 10  mg for anxiety and depression. Take 1/2 tablet by mouth once daily for about one week, then increase to 1 full tablet thereafter.   You may take Trazodone 50 mg at bedtime for sleep.   You will either be contacted via phone regarding your referral to psychiatry, or you may receive a letter on your MyChart portal from our referral  team with instructions for scheduling an appointment. Please let us know if you have not been contacted by anyone within two weeks.  Please schedule a follow up visit for 1 month.  It was a pleasure to see you today!   Doreene Nest, NP

## 2022-12-20 NOTE — Assessment & Plan Note (Signed)
Uncontrolled.  Referral placed to psychiatry.  Start Lexapro 10 mg daily.   Patient is to take 1/2 tablet daily for 8 days, then advance to 1 full tablet thereafter. We discussed possible side effects of headache, GI upset, drowsiness.  Start Trazodone 50 mg HS.  Follow up in 4 weeks for re-evaluation.   

## 2022-12-26 LAB — CYTOLOGY - PAP
Comment: NEGATIVE
Diagnosis: NEGATIVE
High risk HPV: NEGATIVE

## 2022-12-31 ENCOUNTER — Encounter: Payer: Self-pay | Admitting: Obstetrics and Gynecology

## 2022-12-31 ENCOUNTER — Ambulatory Visit (INDEPENDENT_AMBULATORY_CARE_PROVIDER_SITE_OTHER): Payer: Medicaid Other | Admitting: Obstetrics and Gynecology

## 2022-12-31 VITALS — BP 136/81 | HR 86 | Wt 275.0 lb

## 2022-12-31 DIAGNOSIS — Z3046 Encounter for surveillance of implantable subdermal contraceptive: Secondary | ICD-10-CM | POA: Diagnosis not present

## 2022-12-31 DIAGNOSIS — N926 Irregular menstruation, unspecified: Secondary | ICD-10-CM | POA: Diagnosis not present

## 2022-12-31 HISTORY — PX: REMOVAL OF IMPLANON ROD: OBO 1006

## 2022-12-31 NOTE — Progress Notes (Signed)
Patient presents for Nexplanon  Removal. Per last notes rod has migrated from original placement.  Per pt inserted 2017/2017  Pt is abstinent.

## 2022-12-31 NOTE — Procedures (Addendum)
Nexplanon Removal Procedure Note Prior to the procedure being performed, the patient (or guardian) was asked to state their full name, date of birth, type of procedure being performed and the exact location of the operative site. This information was then checked against the documentation in the patient's chart. Prior to the procedure being performed, a "time out" was performed by the physician that confirmed the correct patient, procedure and site.  After informed consent was obtained, the patient's left arm was examined and the Nexplanon rod was noted to be easily palpable but very mobile. The area was cleaned with alcohol then local anesthesia was infiltrated with 3 ml of 1% lidocaine with epinephrine. The area was prepped with betadine. Using sterile technique, an 11 blade was used to make an incision, and the Nexplanon device was brought to the incision site. The capsule was scrapped off with the scalpel, the Nexplanon grasped with hemostats, and easily removed; the removal site was hemostatic; patient moved LUE without issueThe Nexplanon was inspected and noted to be intact.  A steri-strip and a pressure dressing was applied.  The patient tolerated the procedure well.  She chose to do nothing for birth control.    Cornelia Copa MD Attending [facility])

## 2022-12-31 NOTE — Progress Notes (Signed)
Obstetrics and Gynecology Visit Return Patient Evaluation  Appointment Date: 12/31/2022  Primary Care Provider: [facility] Clinic: [facility] Complaint: chronic irregular periods, nexplanon removal  History of Present Illness:  Michelle [name] is a 32 y.o. pt see in may for annual and had expired nexplanon. CNM did not feel comfortable with removal so referred to MD.   Patient states she has a long history of irregular before the nexplanon and with it in place; patient abstinent. She desires removal and to be hormone free.   Review of Systems: as noted in the History of Present Illness.   Patient Active Problem List   Diagnosis Date Noted   Anxiety and depression 12/20/2022   Preventative health care 10/11/2022   Gastroesophageal reflux disease without esophagitis 11/21/2020   Hidradenitis suppurativa 07/04/2020   Cholesteatoma of attic of right ear 08/06/2019   Conductive hearing loss of right ear with unrestricted hearing of left ear 08/06/2019   Smokes 08/06/2019   Asthma 08/05/2019   Cyst of ovary 08/15/2016   Headache, migraine 03/12/2016   Bipolar disorder, current episode mixed, moderate (HCC) 03/12/2016   Class 3 severe obesity due to excess calories with body mass index (BMI) of 45.0 to 49.9 in adult [facility]) 03/12/2016   Renal structural abnormality 03/12/2016   Abnormal cervical Papanicolaou smear 12/16/2009   Medications:  Charmon Thorson. Solecki had no medications administered during this visit. Current Outpatient Medications  Medication Sig Dispense Refill   albuterol (PROAIR HFA) 108 (90 Base) MCG/ACT inhaler Inhale 1-2 puffs into the lungs every 6 (six) hours as needed for wheezing or shortness of breath. 18 g 0   Chloramphenicol POWD by Does not apply route. Used in the ear per ENT.     doxycycline (VIBRA-TABS) 100 MG tablet Take 1 tablet (100 mg total) by mouth 2 (two) times daily. 14 tablet 0   escitalopram  (LEXAPRO) 10 MG tablet Take 1 tablet (10 mg total) by mouth daily. for anxiety and depression. 90 tablet 0   etonogestrel (NEXPLANON) 68 MG IMPL implant Nexplanon 68 mg subdermal implant  Inject 1 implant by subcutaneous route.     fluticasone (FLOVENT HFA) 110 MCG/ACT inhaler Inhale 1 puff into the lungs 2 (two) times daily. For asthma 1 Inhaler 2   traZODone (DESYREL) 50 MG tablet Take 1 tablet (50 mg total) by mouth at bedtime. For sleep 90 tablet 0   No current facility-administered medications for this visit.    Allergies: is allergic to augmentin [amoxicillin-pot clavulanate] and shrimp [shellfish allergy].  Physical Exam:  BP 136/81   Pulse 86   Wt 275 lb (124.7 kg)   BMI 48.71 kg/m  Body mass index is 48.71 kg/m. General appearance: Well nourished, well developed female in no acute distress.  Neuro/Psych:  Normal mood and affect.    See procedure note for LUE nexplanon removal.   Assessment: patient doing well  Plan:  1. Irregular periods I d/w her importance of regular, monthly periods if not on any hormones. This will help to decrease risk of unexpected periods, heavy periods, prolonged periods and decrease risk of endometrial hyperplasia/uterine cancer.  Patient desires to follow up in 2-3 months to see how her periods are   Return in about 2 months (around 03/02/2023) for in person, md or app.  No future appointments.  Cornelia Copa MD Attending [facility])

## 2023-01-01 DIAGNOSIS — H7101 Cholesteatoma of attic, right ear: Secondary | ICD-10-CM | POA: Diagnosis not present

## 2023-03-05 ENCOUNTER — Ambulatory Visit: Payer: Medicaid Other | Admitting: Primary Care

## 2023-03-07 ENCOUNTER — Ambulatory Visit: Payer: Medicaid Other | Admitting: Family Medicine

## 2023-03-07 ENCOUNTER — Encounter: Payer: Self-pay | Admitting: Family Medicine

## 2023-03-07 VITALS — BP 110/60 | HR 92 | Temp 97.5°F | Ht 63.0 in | Wt 265.4 lb

## 2023-03-07 DIAGNOSIS — R109 Unspecified abdominal pain: Secondary | ICD-10-CM

## 2023-03-07 DIAGNOSIS — N898 Other specified noninflammatory disorders of vagina: Secondary | ICD-10-CM | POA: Diagnosis not present

## 2023-03-07 DIAGNOSIS — R801 Persistent proteinuria, unspecified: Secondary | ICD-10-CM

## 2023-03-07 LAB — POC URINALSYSI DIPSTICK (AUTOMATED)
Bilirubin, UA: NEGATIVE
Blood, UA: NEGATIVE
Glucose, UA: NEGATIVE
Leukocytes, UA: NEGATIVE
Nitrite, UA: NEGATIVE
Protein, UA: POSITIVE — AB
Spec Grav, UA: >=1.03 — AB (ref 1.010–1.025)
Urobilinogen, UA: 0.2 E.U./dL
pH, UA: 6 (ref 5.0–8.0)

## 2023-03-07 NOTE — Assessment & Plan Note (Signed)
This was noted on urinalysis today as well as previously in 2022.  The ketones could be secondary to her recent change in diet but on review her diet does not seem to be particularly high in protein.  Given her unknown past history of kidney issues I encouraged her to follow-up with her PCP for repeat urinalysis and possible further evaluation with blood work/renal ultrasound.  She will continue to push water given the specific gravity was also high suggesting dehydration.

## 2023-03-07 NOTE — Patient Instructions (Signed)
We will call with wet prep results. Keep up with water intake.

## 2023-03-07 NOTE — Assessment & Plan Note (Signed)
Acute, will evaluate with send out wet prep to look for possible Candida versus BV.   This likely is the cause of her vaginal and urinary pressure symptoms.  Urinalysis shows no evidence of urinary tract infection.  Return and ER precautions provided

## 2023-03-07 NOTE — Progress Notes (Signed)
Patient ID: Michelle [name], female    DOB: September 11, 1990, 32 y.o.   MRN: [id number]  This visit was conducted in person.  BP 110/60 (BP Location: Left Arm, Patient Position: Sitting, Cuff Size: Large)   Pulse 92   Temp (!) 97.5 F (36.4 C) (Temporal)   Ht 5\' 3"  (1.6 m)   Wt 265 lb 6 oz (120.4 kg)   SpO2 97%   BMI 47.01 kg/m    CC:  Chief Complaint  Patient presents with   Pressure with Urination   Abnormal Odor    Subjective:   HPI: Michelle [name] is a 32 y.o. female of Michelle [name] presenting on 03/07/2023 for Pressure with Urination and Abnormal Odor   She reports new onset  vaginal changes in last 2-3 weeks.  She  had started using a new sex toy... it had a strange odor.  Noted fishy odor after using the toy.  Progressed to clear discharge and now milky white discharge  in last week.  Having some vaginal dryness,  and vaginal door.   Notes pressure vaginally when has to uriate. No abd pain.  No frequency or urgency with urination.  NO fever, no blood in urine.  Darker colored urine.... despite drinking lots of water... urine has odor as well.   No flank pain.   No history of UTI, kidney infections. No kidney stones.   No antibiotic in last month.    Has history of reaction to condoms in past.  She remarks that she has some past kidney issue but is not sure what it is.  Workup was started briefly where she lived previously.    Of no protein noted in her urine in November 2022 at last urinalysis, prior urinalysis within normal limits. Nml Cr/GFR in 09/2032  She has started a new diet change with protein shake in the morning 6 to 7 glasses of water each day and restricted time of eating.  Relevant past medical, surgical, family and social history reviewed and updated as indicated. Interim medical history since our last visit reviewed. Allergies and medications reviewed and updated. Outpatient Medications Prior to Visit  Medication Sig Dispense Refill   albuterol  (PROAIR HFA) 108 (90 Base) MCG/ACT inhaler Inhale 1-2 puffs into the lungs every 6 (six) hours as needed for wheezing or shortness of breath. 18 g 0   Chloramphenicol POWD by Does not apply route. Used in the ear per ENT.     fluticasone (FLOVENT HFA) 110 MCG/ACT inhaler Inhale 1 puff into the lungs 2 (two) times daily. For asthma 1 Inhaler 2   escitalopram (LEXAPRO) 10 MG tablet Take 1 tablet (10 mg total) by mouth daily. for anxiety and depression. (Patient not taking: Reported on 03/07/2023) 90 tablet 0   traZODone (DESYREL) 50 MG tablet Take 1 tablet (50 mg total) by mouth at bedtime. For sleep (Patient not taking: Reported on 03/07/2023) 90 tablet 0   doxycycline (VIBRA-TABS) 100 MG tablet Take 1 tablet (100 mg total) by mouth 2 (two) times daily. 14 tablet 0   No facility-administered medications prior to visit.     Per HPI unless specifically indicated in ROS section below Review of Systems  Constitutional:  Negative for fatigue and fever.  HENT:  Negative for congestion.   Eyes:  Negative for pain.  Respiratory:  Negative for cough and shortness of breath.   Cardiovascular:  Negative for chest pain, palpitations and leg swelling.  Gastrointestinal:  Negative for abdominal pain.  Genitourinary:  Positive for dysuria. Negative for frequency, hematuria, urgency and vaginal bleeding.  Musculoskeletal:  Negative for back pain.  Neurological:  Negative for syncope, light-headedness and headaches.  Psychiatric/Behavioral:  Negative for dysphoric mood.    Objective:  BP 110/60 (BP Location: Left Arm, Patient Position: Sitting, Cuff Size: Large)   Pulse 92   Temp (!) 97.5 F (36.4 C) (Temporal)   Ht 5\' 3"  (1.6 m)   Wt 265 lb 6 oz (120.4 kg)   SpO2 97%   BMI 47.01 kg/m   Wt Readings from Last 3 Encounters:  03/07/23 265 lb 6 oz (120.4 kg)  12/31/22 275 lb (124.7 kg)  12/20/22 275 lb (124.7 kg)      Physical Exam Constitutional:      General: She is not in acute distress.     Appearance: Normal appearance. She is well-developed. She is not ill-appearing or toxic-appearing.  HENT:     Head: Normocephalic.     Right Ear: Hearing, tympanic membrane, ear canal and external ear normal. Tympanic membrane is not erythematous, retracted or bulging.     Left Ear: Hearing, tympanic membrane, ear canal and external ear normal. Tympanic membrane is not erythematous, retracted or bulging.     Nose: No mucosal edema or rhinorrhea.     Right Sinus: No maxillary sinus tenderness or frontal sinus tenderness.     Left Sinus: No maxillary sinus tenderness or frontal sinus tenderness.     Mouth/Throat:     Mouth: Oropharynx is clear and moist and mucous membranes are normal.     Pharynx: Uvula midline.  Eyes:     General: Lids are normal. Lids are everted, no foreign bodies appreciated.     Extraocular Movements: EOM normal.     Conjunctiva/sclera: Conjunctivae normal.     Pupils: Pupils are equal, round, and reactive to light.  Neck:     Thyroid: No thyroid mass or thyromegaly.     Vascular: No carotid bruit.     Trachea: Trachea normal.  Cardiovascular:     Rate and Rhythm: Normal rate and regular rhythm.     Pulses: Normal pulses.     Heart sounds: Normal heart sounds, S1 normal and S2 normal. No murmur heard.    No friction rub. No gallop.  Pulmonary:     Effort: Pulmonary effort is normal. No tachypnea or respiratory distress.     Breath sounds: Normal breath sounds. No decreased breath sounds, wheezing, rhonchi or rales.  Abdominal:     General: Bowel sounds are normal.     Palpations: Abdomen is soft.     Tenderness: There is no abdominal tenderness.  Musculoskeletal:     Cervical back: Normal range of motion and neck supple.  Skin:    General: Skin is warm, dry and intact.     Findings: No rash.  Neurological:     Mental Status: She is alert.  Psychiatric:        Mood and Affect: Mood is not anxious or depressed.        Speech: Speech normal.         Behavior: Behavior normal. Behavior is cooperative.        Thought Content: Thought content normal.        Cognition and Memory: Cognition and memory normal.        Judgment: Judgment normal.       Results for orders placed or performed in visit on 03/07/23  POCT Urinalysis Dipstick (Automated)  Result  Value Ref Range   Color, UA Yellow    Clarity, UA Clear    Glucose, UA Negative Negative   Bilirubin, UA Negative    Ketones, UA Large (3+)    Spec Grav, UA >=1.030 (A) 1.010 - 1.025   Blood, UA Negative    pH, UA 6.0 5.0 - 8.0   Protein, UA Positive (A) Negative   Urobilinogen, UA 0.2 0.2 or 1.0 E.U./dL   Nitrite, UA Negative    Leukocytes, UA Negative Negative    Assessment and Plan  Abdominal pressure -     POCT Urinalysis Dipstick (Automated)  Persistent proteinuria Assessment & Plan: This was noted on urinalysis today as well as previously in 2022.  The ketones could be secondary to her recent change in diet but on review her diet does not seem to be particularly high in protein.  Given her unknown past history of kidney issues I encouraged her to follow-up with her PCP for repeat urinalysis and possible further evaluation with blood work/renal ultrasound.  She will continue to push water given the specific gravity was also high suggesting dehydration.   Vaginal discharge Assessment & Plan: Acute, will evaluate with send out wet prep to look for possible Candida versus BV.   This likely is the cause of her vaginal and urinary pressure symptoms.  Urinalysis shows no evidence of urinary tract infection.  Return and ER precautions provided  Orders: -     WET PREP BY MOLECULAR PROBE    Return in about 4 weeks (around 04/04/2023) for Follow-up proteinuria with PCP Michelle [name].Kerby Nora, MD

## 2023-03-08 ENCOUNTER — Telehealth: Payer: Self-pay | Admitting: Primary Care

## 2023-03-08 LAB — WET PREP BY MOLECULAR PROBE
Candida species: NOT DETECTED
Gardnerella vaginalis: NOT DETECTED
MICRO NUMBER:: 15336035
SPECIMEN QUALITY:: ADEQUATE
Trichomonas vaginosis: NOT DETECTED

## 2023-03-08 MED ORDER — METRONIDAZOLE 500 MG PO TABS: 500.0000 mg | ORAL_TABLET | Freq: Two times a day (BID) | ORAL | 0 refills | Status: DC

## 2023-03-08 NOTE — Telephone Encounter (Signed)
Call patient.  I I will send in medication to treat bacterial vaginosis.  She can fill this unless we get results back by the end of the daY. Do not take this medication with alcohol.  Sent metronidazole 500 mg twice daily x 7 days to [facility]

## 2023-03-08 NOTE — Telephone Encounter (Signed)
Neda notified as instructed by telephone.  Patient states understanding.

## 2023-03-08 NOTE — Telephone Encounter (Signed)
Patient contacted the office regarding visit from yesterday, states she is waiting on labs to come back but was told by bedsole that if labs weren't back by fri afternoon that she would call in medication for her. Patient was wanting to know if this can be done? Patient uses [facility] for pharmacy, would like a returned call if labs are back.

## 2023-05-02 ENCOUNTER — Ambulatory Visit: Payer: Medicaid Other | Admitting: Obstetrics and Gynecology

## 2023-05-02 ENCOUNTER — Encounter: Payer: Self-pay | Admitting: Obstetrics and Gynecology

## 2023-05-02 ENCOUNTER — Other Ambulatory Visit (HOSPITAL_COMMUNITY)
Admission: RE | Admit: 2023-05-02 | Discharge: 2023-05-02 | Disposition: A | Discharge status: Home / Self Care | Payer: Medicaid Other | Source: Ambulatory Visit | Attending: Obstetrics and Gynecology | Admitting: Obstetrics and Gynecology

## 2023-05-02 VITALS — BP 107/77 | HR 84 | Wt 256.0 lb

## 2023-05-02 DIAGNOSIS — Z113 Encounter for screening for infections with a predominantly sexual mode of transmission: Secondary | ICD-10-CM | POA: Diagnosis not present

## 2023-05-02 DIAGNOSIS — Z3009 Encounter for other general counseling and advice on contraception: Secondary | ICD-10-CM | POA: Diagnosis not present

## 2023-05-02 MED ORDER — PHEXXI 1.8-1-0.4 % VA GEL: 1.0000 | VAGINAL | 12 refills | Status: DC | PRN

## 2023-05-02 MED ORDER — PHEXXI 1.8-1-0.4 % VA GEL: 1.0000 | VAGINAL | 3 refills | Status: DC | PRN

## 2023-05-02 NOTE — Progress Notes (Signed)
32 yo P1 with LMP 04/22/23 and BMI 45 here for STI testing and contraception counseling. Patient reports regular cycles since Nexplanon removal in June 2024. She became sexually active for the first time on 04/27/23 without contraception or condoms. Patient is without any complaints and has general questions regarding contraception  Past Medical History:  Diagnosis Date   Asthma    Bilateral lower abdominal cramping 11/21/2020   Bipolar disorder (manic depression) (HCC)    Dysmenorrhea    Elevated testosterone level in female    ETD (eustachian tube dysfunction) 02/08/2021   Hypertriglyceridemia    Insulin resistance    Migraines    Ruptured tympanic membrane 03/12/2016   Past Surgical History:  Procedure Laterality Date   REMOVAL OF IMPLANON ROD  12/31/2022   TYMPANOMASTOIDECTOMY WITH RECONSTRUCTION Right 09/21/2019   Procedure: TYMPANOMASTOIDECTOMY WITH RECONSTRUCTION;  Surgeon: Serena Colonel, MD;  Location: [facility];  Service: ENT;  Laterality: Right;   WISDOM TOOTH EXTRACTION     Family History  Problem Relation Age of Onset   Gestational diabetes Mother    Hyperlipidemia Maternal Grandfather    Social History   Tobacco Use   Smoking status: Every Day    Types: E-cigarettes   Smokeless tobacco: Never  Vaping Use   Vaping status: Every Day  Substance Use Topics   Alcohol use: No   Drug use: No   ROS See pertinent in HPI. All other systems reviewed and non contributory Blood pressure 107/77, pulse 84, weight 256 lb (116.1 kg), last menstrual period 04/22/2023. GENERAL: Well-developed, well-nourished female in no acute distress.  NEURO: alert and oriented x 3  A/P 32 yo here for contraception counseling and STI screening - patient current on pap smear - Self swab collected. Patient declined blood work - Patient will be contacted with abnormal results - Contraception options reviewed with the patient. Patient is interested in non- hormonal contraception.  She is considering Paraguard IUD. Rx Phexxi provided in the meantime to try - RTC when ready for contraception

## 2023-05-02 NOTE — Progress Notes (Signed)
RGYN here to discuss Birth Control   LMP:04/22/23  pt had had regular periods since taking Nexplanon out.  STD Screening: desires swab wants BV and yeast as well.  Pt notes unprotected intercourse Saturday.,

## 2023-05-02 NOTE — Addendum Note (Signed)
Addended by: Catalina Antigua on: 05/02/2023 01:13 PM   Modules accepted: Orders

## 2023-05-03 LAB — CERVICOVAGINAL ANCILLARY ONLY
Bacterial Vaginitis (gardnerella): NEGATIVE
Candida Glabrata: NEGATIVE
Candida Vaginitis: NEGATIVE
Chlamydia: NEGATIVE
Comment: NEGATIVE
Comment: NEGATIVE
Comment: NEGATIVE
Comment: NEGATIVE
Comment: NEGATIVE
Comment: NORMAL
Neisseria Gonorrhea: NEGATIVE
Trichomonas: POSITIVE — AB

## 2023-05-06 MED ORDER — METRONIDAZOLE 500 MG PO TABS: 500.0000 mg | ORAL_TABLET | Freq: Two times a day (BID) | ORAL | 0 refills | Status: DC

## 2023-05-06 NOTE — Addendum Note (Signed)
Addended by: Catalina Antigua on: 05/06/2023 05:00 PM   Modules accepted: Orders

## 2023-08-06 ENCOUNTER — Other Ambulatory Visit: Payer: Self-pay | Admitting: Primary Care

## 2023-08-06 DIAGNOSIS — J4540 Moderate persistent asthma, uncomplicated: Secondary | ICD-10-CM

## 2023-08-06 NOTE — Telephone Encounter (Signed)
 Copied from CRM [id number]. Topic: Clinical - Medication Refill >> Aug 06, 2023 11:42 AM Benton KIDD wrote: Most Recent Primary Care Visit:  Provider: AVELINA NO E  Department: [facility]  Visit Type: OFFICE VISIT  Date: 03/07/2023  Medication: fluticasone  (FLOVENT  HFA) 110 MCG/ACT inhaler  Has the patient contacted their pharmacy? Yes HAVE TO Speak doctor about refill (Agent: If no, request that the patient contact the pharmacy for the refill. If patient does not wish to contact the pharmacy document the reason why and proceed with request.) (Agent: If yes, when and what did the pharmacy advise?)  Is this the correct pharmacy for this prescription? Yes If no, delete pharmacy and type the correct one.  This is the patient's preferred pharmacy:  [facility] - [place], [place] - [address] [place] [address] Phone: [phone] Fax: [phone]  [facility] [phone] - [address], [place] - [address] [place] [address] Phone: [phone] Fax: [phone]  [id number] - [place], [place] - [address] AT [facility] [address] [place] [address] Phone: [phone] Fax: [phone]   Has the prescription been filled recently? No got it 3 years ago but the insurance didn't cover it probably got the inhaler like twice but have insurance will be able to keep refills   Is the patient out of the medication? Yes  Has the patient been seen for an appointment in the last year OR does the patient have an upcoming appointment? No  Can we respond through MyChart? Yes  Agent: Please be advised that Rx refills may take up to 3 business days. We ask that you follow-up with your pharmacy.

## 2023-08-07 MED ORDER — FLUTICASONE PROPIONATE HFA 110 MCG/ACT IN AERO
1.0000 | INHALATION_SPRAY | Freq: Two times a day (BID) | RESPIRATORY_TRACT | 0 refills | Status: DC
Start: 2023-08-07 — End: 2023-12-03

## 2023-08-08 ENCOUNTER — Telehealth (INDEPENDENT_AMBULATORY_CARE_PROVIDER_SITE_OTHER): Payer: Medicaid Other | Admitting: Primary Care

## 2023-08-08 ENCOUNTER — Encounter: Payer: Self-pay | Admitting: Primary Care

## 2023-08-08 DIAGNOSIS — R809 Proteinuria, unspecified: Secondary | ICD-10-CM

## 2023-08-08 DIAGNOSIS — Q639 Congenital malformation of kidney, unspecified: Secondary | ICD-10-CM | POA: Diagnosis not present

## 2023-08-08 DIAGNOSIS — H7101 Cholesteatoma of attic, right ear: Secondary | ICD-10-CM

## 2023-08-08 NOTE — Assessment & Plan Note (Signed)
Reviewed CT scan from 2008.  Renal ultrasound ordered and pending.

## 2023-08-08 NOTE — Progress Notes (Signed)
Patient ID: Michelle [name], female    DOB: [birth year], 33 y.o.   MRN: [id number]  Virtual visit completed through caregility, a video enabled telemedicine application. Due to national recommendations of social distancing due to COVID-19, a virtual visit is felt to be most appropriate for this patient at this time. Reviewed limitations, risks, security and privacy concerns of performing a virtual visit and the availability of in person appointments. I also reviewed that there may be a patient responsible charge related to this service. The patient agreed to proceed.   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    There were no vitals taken for this visit.   CC: Multiple questions Subjective:   HPI: Michelle [name] is a 33 y.o. female with a history of migraines, asthma, bipolar disorder, cholesteatoma of attic of right ear, conductive hearing loss of right ear presenting on 08/08/2023 for Travel Consult (Patient is traveling on plane soon and is concerned for complication is with right ear due to having surgery on it. ) and Imaging (Patient states she was asked to completed US of kidneys a couple years ago however she did not have insurance so she did not complete. She now has coverage and would like to have this reordered. )   1) Cholesteatoma of Attic of Right Ear: She underwent surgery in March 2021. She will be flying to [place] in 3 weeks, has never traveled on a plane before. She is nervous about the potential for ear complications due to the air pressure. She has not seen ENT in > 1 year, is supposed to follow up at least twice annually for proper ear cleaning. She does have chronic ear fullness, ears do not typically pop.   2) Renal Structure Abnormality: History of atrophic, dysplastic right kidney  with extensive scarring, and compensatory hypertrophy of the left kidney. Noted on CT scan from 2008.  In 2022 we ordered a renal ultrasound for reevaluation and due to proteinuria.  Unfortunately, she was unable to obtain the ultrasound as it was cost prohibitive.  Today she is ready to obtain the ultrasound of her kidneys. She's had multiple episodes of proteinuria over the last 2 years.   Relevant past medical, surgical, family and social history reviewed and updated as indicated. Interim medical history since our last visit reviewed. Allergies and medications reviewed and updated. Outpatient Medications Prior to Visit  Medication Sig Dispense Refill   albuterol (PROAIR HFA) 108 (90 Base) MCG/ACT inhaler Inhale 1-2 puffs into the lungs every 6 (six) hours as needed for wheezing or shortness of breath. 18 g 0   fluticasone (FLOVENT HFA) 110 MCG/ACT inhaler Inhale 1 puff into the lungs 2 (two) times daily. For asthma 1 each 0   Chloramphenicol POWD by Does not apply route. Used in the ear per ENT. (Patient not taking: Reported on 08/08/2023)     escitalopram (LEXAPRO) 10 MG tablet Take 1 tablet (10 mg total) by mouth daily. for anxiety and depression. (Patient not taking: Reported on 08/08/2023) 90 tablet 0   Lactic Ac-Citric Ac-Pot Bitart (PHEXXI) 1.8-1-0.4 % GEL Place 1 Applicatorful vaginally as needed. Up to 1 hour before intercourse (Patient not taking: Reported on 08/08/2023) 60 g 12   metroNIDAZOLE (FLAGYL) 500 MG tablet Take 1 tablet (500 mg total) by mouth 2 (two) times daily. (Patient  not taking: Reported on 08/08/2023) 14 tablet 0   metroNIDAZOLE (FLAGYL) 500 MG tablet Take 1 tablet (500 mg total) by mouth 2 (two) times daily. (Patient not taking: Reported on 08/08/2023) 14 tablet 0   traZODone (DESYREL) 50 MG tablet Take 1 tablet (50 mg total) by mouth at bedtime. For sleep (Patient not taking: Reported on 08/08/2023) 90 tablet 0   No facility-administered medications prior to visit.      Per HPI unless specifically indicated in ROS section below Review of Systems  HENT:  Negative for congestion, ear discharge and ear pain.   Genitourinary:  Negative for difficulty urinating and hematuria.   Objective:  There were no vitals taken for this visit.  Wt Readings from Last 3 Encounters:  05/02/23 256 lb (116.1 kg)  03/07/23 265 lb 6 oz (120.4 kg)  12/31/22 275 lb (124.7 kg)       Physical exam: General: Alert and oriented x 3, no distress, does not appear sickly  Pulmonary: Speaks in complete sentences without increased work of breathing, no cough during visit.  Psychiatric: Normal mood, thought content, and behavior.     Results for orders placed or performed in visit on 05/02/23  Cervicovaginal ancillary only( [place])   Collection Time: 05/02/23 11:50 AM  Result Value Ref Range   Neisseria Gonorrhea Negative    Chlamydia Negative    Trichomonas Positive (A)    Bacterial Vaginitis (gardnerella) Negative    Candida Vaginitis Negative    Candida Glabrata Negative    Comment      Normal Reference Range Bacterial Vaginosis - Negative   Comment Normal Reference Range Candida Species - Negative    Comment Normal Reference Range Candida Galbrata - Negative    Comment Normal Reference Range Trichomonas - Negative    Comment Normal Reference Ranger Chlamydia - Negative    Comment      Normal Reference Range Neisseria Gonorrhea - Negative   Assessment & Plan:   Problem List Items Addressed This Visit       Nervous and Auditory   Cholesteatoma of attic of right ear   Recommended to start daily antihistamine now, continue during trip, and stop once home. Recommended to take Flonase nasal spray to use if needed.  Recommended follow up with ENT        Genitourinary   Renal structural abnormality - Primary   Reviewed CT scan from 2008.  Renal ultrasound ordered and pending.      Relevant Orders   US RENAL   Other Visit Diagnoses        Proteinuria, unspecified type       Relevant Orders   US RENAL        No orders of the defined types were placed in this encounter.  Orders Placed This Encounter  Procedures   US RENAL    33 year old female with chronic right renal atrophy with compensatory left renal hypertrophy. See CT scan from 2008. Needs re-evaluation.    Standing Status:   Future    Expiration Date:   08/07/2024    Reason for Exam (SYMPTOM  OR DIAGNOSIS REQUIRED):   chronic right renal atrophy, proteinuria    Preferred imaging location?:   [facility]    I discussed the assessment and treatment plan with the patient. The patient was provided an opportunity to ask questions and all were answered. The patient agreed with the plan and demonstrated an understanding of the instructions. The patient was advised to  call back or seek an in-person evaluation if the symptoms worsen or if the condition fails to improve as anticipated.  Follow up plan:  You will receive a phone call regarding the kidney ultrasound.  Start taking an antihistamine such as Claritin, Zyrtec, Allegra before your flight. Get the generic/off brand. This works just as well.  Take a bottle of Flonase (fluticasone) nasal spray if you need it. Instill 1 spray in each nostril twice daily.   It was a pleasure to see you today!  Doreene Nest, NP

## 2023-08-08 NOTE — Patient Instructions (Signed)
You will receive a phone call regarding the kidney ultrasound.  Start taking an antihistamine such as Claritin, Zyrtec, Allegra before your flight. Get the generic/off brand. This works just as well.  Take a bottle of Flonase (fluticasone) nasal spray if you need it. Instill 1 spray in each nostril twice daily.   It was a pleasure to see you today!

## 2023-08-08 NOTE — Assessment & Plan Note (Signed)
Recommended to start daily antihistamine now, continue during trip, and stop once home. Recommended to take Flonase nasal spray to use if needed.  Recommended follow up with ENT

## 2023-08-28 ENCOUNTER — Ambulatory Visit: Payer: Medicaid Other | Admitting: Primary Care

## 2023-08-29 ENCOUNTER — Encounter: Payer: Self-pay | Admitting: Primary Care

## 2023-09-03 ENCOUNTER — Ambulatory Visit
Admission: RE | Admit: 2023-09-03 | Discharge: 2023-09-03 | Disposition: A | Discharge status: Home / Self Care | Payer: Medicaid Other | Source: Ambulatory Visit | Attending: Primary Care | Admitting: Primary Care

## 2023-09-03 DIAGNOSIS — N261 Atrophy of kidney (terminal): Secondary | ICD-10-CM | POA: Diagnosis not present

## 2023-09-03 DIAGNOSIS — R809 Proteinuria, unspecified: Secondary | ICD-10-CM | POA: Insufficient documentation

## 2023-09-03 DIAGNOSIS — Q639 Congenital malformation of kidney, unspecified: Secondary | ICD-10-CM | POA: Diagnosis not present

## 2023-10-17 DIAGNOSIS — H7101 Cholesteatoma of attic, right ear: Secondary | ICD-10-CM | POA: Diagnosis not present

## 2023-10-17 DIAGNOSIS — H9011 Conductive hearing loss, unilateral, right ear, with unrestricted hearing on the contralateral side: Secondary | ICD-10-CM | POA: Diagnosis not present

## 2023-12-03 ENCOUNTER — Ambulatory Visit (INDEPENDENT_AMBULATORY_CARE_PROVIDER_SITE_OTHER): Admitting: Primary Care

## 2023-12-03 ENCOUNTER — Telehealth: Payer: Self-pay

## 2023-12-03 ENCOUNTER — Other Ambulatory Visit (HOSPITAL_COMMUNITY): Payer: Self-pay

## 2023-12-03 ENCOUNTER — Encounter: Payer: Self-pay | Admitting: Primary Care

## 2023-12-03 VITALS — BP 122/78 | HR 77 | Temp 97.8°F | Ht 63.0 in | Wt 265.0 lb

## 2023-12-03 DIAGNOSIS — F419 Anxiety disorder, unspecified: Secondary | ICD-10-CM

## 2023-12-03 DIAGNOSIS — N898 Other specified noninflammatory disorders of vagina: Secondary | ICD-10-CM | POA: Diagnosis not present

## 2023-12-03 DIAGNOSIS — E66813 Obesity, class 3: Secondary | ICD-10-CM

## 2023-12-03 DIAGNOSIS — Z6841 Body Mass Index (BMI) 40.0 and over, adult: Secondary | ICD-10-CM | POA: Diagnosis not present

## 2023-12-03 DIAGNOSIS — J4540 Moderate persistent asthma, uncomplicated: Secondary | ICD-10-CM | POA: Diagnosis not present

## 2023-12-03 DIAGNOSIS — F32A Depression, unspecified: Secondary | ICD-10-CM | POA: Diagnosis not present

## 2023-12-03 MED ORDER — SEMAGLUTIDE-WEIGHT MANAGEMENT 0.25 MG/0.5ML ~~LOC~~ SOAJ
0.2500 mg | SUBCUTANEOUS | 0 refills | Status: DC
Start: 2023-12-03 — End: 2023-12-27

## 2023-12-03 MED ORDER — TRAZODONE HCL 50 MG PO TABS: 50.0000 mg | ORAL_TABLET | Freq: Every day | ORAL | 0 refills | Status: DC

## 2023-12-03 MED ORDER — BUDESONIDE-FORMOTEROL FUMARATE 80-4.5 MCG/ACT IN AERO
1.0000 | INHALATION_SPRAY | Freq: Two times a day (BID) | RESPIRATORY_TRACT | 3 refills | Status: DC
Start: 2023-12-03 — End: 2024-05-29

## 2023-12-03 NOTE — Assessment & Plan Note (Signed)
 Discussed options for treatment.  Start semaglutide [facility]) for weight loss. Start by injecting 0.25 mg into the skin once weekly for 4 weeks, then increase to 0.5 mg once weekly thereafter.   Continue regular exercise and a healthy diet.  Follow-up in 3 months.

## 2023-12-03 NOTE — Assessment & Plan Note (Signed)
 Uncontrolled without daily maintenance therapy.  Start Symbicort 80 mcg inhaler, 1 puff BID. Rx sent to pharmacy. She will update if the Symbicort is cost prohibitive.   Continue albuterol  inhaler PRN

## 2023-12-03 NOTE — Addendum Note (Signed)
 Addended by: Bernyce Brimley K on: 12/03/2023 03:17 PM   Modules accepted: Orders

## 2023-12-03 NOTE — Patient Instructions (Signed)
 Start semaglutide [facility]) for weight loss. Start by injecting 0.25 mg into the skin once weekly for 4 weeks, then increase to 0.5 mg once weekly thereafter.  Please notify me once you have used your last 0.25 mg pen so that I can send the 0.5 mg dose to your pharmacy.  We will be in touch once we receive your vaginal swab results.  Please schedule a follow up visit for 3 months.

## 2023-12-03 NOTE — Assessment & Plan Note (Signed)
 Wet prep, gonorrhea, chlamydia swabs collected and pending today.

## 2023-12-03 NOTE — Assessment & Plan Note (Addendum)
 Overall controlled.  Remain off Lexapro  10 mg for now. Resume trazodone  50 mg at bedtime to see if this helps with sleep initiation. She will update.  Continue to monitor.

## 2023-12-03 NOTE — Progress Notes (Signed)
 Subjective:    Patient ID: Michelle [name], female    DOB: 1990-12-22, 33 y.o.   MRN: [id number]  Vaginal Discharge The patient's primary symptoms include vaginal discharge. Pertinent negatives include no dysuria or flank pain.    Michelle [name] is a very pleasant 33 y.o. female with a history of MI, GERD, migraines, bipolar disorder, anxiety, depression who presents today for follow-up of chronic conditions and to discuss several concerns.  1) Asthma: Currently managed on Flovent  110 mcg inhaler albuterol  inhaler as needed. About 2 months ago as her insurance stopped covering.   She's been using a Symbicort inhaler 80 mcg from a friend and has been doing 1 puff per day. With this regimen she's using her albuterol  inhaler every 10-14 days. Without her Symbicort inhaler she will use her albuterol  inhaler multiple times daily.   2) Anxiety/Depression: Currently managed on Lexapro  10 mg daily and trazodone  50 mg at bedtime.  She stopped taking these medications about 1 year ago as she didn't like the way it made her feel.   She feels like she's doing well with anxiety and does not want to resume treatment.   She continues to experience difficulty falling asleep. She didn't like the way she felt on Trazodone . She's been taking Melatonin which helps but causes nightmares.   3) Vaginal Discharge: Symptom onset about 3 days ago with white milky discharge. She has been having unprotected intercourse with a partner who has been with multiple partners. She's also noticed an odor. Denies fevers, dysuria, vaginal itching. History of trichomonas in October 2024.   4) Class 3 Obesity: She has a long history of obesity for most of her life.   She's tried a high protein diet and fasting which helped her to lose 28 pounds but she regained it once she stopped. She is exercising most days weekly. Fasting helps mostly, she resumed fasting 3 weeks ago.   She is interested in starting [facility] for weight loss  assistance given her lifetime struggle.   Diet currently consists of:  Breakfast: Protein drinks Lunch: Fast Dinner: Pizza, fast food sometimes, vegetables, protein, starch (pasta) Snacks: Yogurt  Desserts: twice weekly Beverages: Coffee with creamer, protein drinks, water, diet pepsi    Body mass index is 46.94 kg/m.  BP Readings from Last 3 Encounters:  12/03/23 122/78  05/02/23 107/77  03/07/23 110/60   Wt Readings from Last 3 Encounters:  12/03/23 265 lb (120.2 kg)  05/02/23 256 lb (116.1 kg)  03/07/23 265 lb 6 oz (120.4 kg)     Review of Systems  Respiratory:  Positive for wheezing.   Genitourinary:  Positive for vaginal discharge. Negative for dysuria and flank pain.  Psychiatric/Behavioral:  Positive for sleep disturbance.          Past Medical History:  Diagnosis Date   Asthma    Bilateral lower abdominal cramping 11/21/2020   Bipolar disorder (manic depression) (HCC)    Dysmenorrhea    Elevated testosterone level in female    ETD (eustachian tube dysfunction) 02/08/2021   Hypertriglyceridemia    Insulin resistance    Migraines    Ruptured tympanic membrane 03/12/2016    Social History   Socioeconomic History   Marital status: Single    Spouse name: Not on file   Number of children: Not on file   Years of education: Not on file   Highest education level: 12th grade  Occupational History   Not on file  Tobacco Use   Smoking  status: Every Day    Types: E-cigarettes   Smokeless tobacco: Never  Vaping Use   Vaping status: Every Day  Substance and Sexual Activity   Alcohol use: No   Drug use: No   Sexual activity: Yes    Birth control/protection: Abstinence, None  Other Topics Concern   Not on file  Social History Narrative   Single.   1 child.   Works as a Emergency planning/management officer.   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    Social Drivers of Corporate investment banker Strain: Low Risk  (03/05/2023)   Overall Financial Resource Strain (CARDIA)     Difficulty of Paying Living Expenses: Not very hard  Food Insecurity: Food Insecurity Present (03/05/2023)   Hunger Vital Sign    Worried About Running Out of Food in the Last Year: Never true    Ran Out of Food in the Last Year: Sometimes true  Transportation Needs: No Transportation Needs (03/05/2023)   PRAPARE - Administrator, Civil Service (Medical): No    Lack of Transportation (Non-Medical): No  Physical Activity: Insufficiently Active (03/05/2023)   Exercise Vital Sign    Days of Exercise per Week: 5 days    Minutes of Exercise per Session: 20 min  Stress: No Stress Concern Present (03/05/2023)   [employer] of Occupational Health - Occupational Stress Questionnaire    Feeling of Stress : Only a little  Social Connections: Socially Isolated (03/05/2023)   Social Connection and Isolation Panel [NHANES]    Frequency of Communication with Friends and Family: More than three times a week    Frequency of Social Gatherings with Friends and Family: Once a week    Attends Religious Services: Never    Database administrator or Organizations: No    Attends Engineer, structural: Not on file    Marital Status: Never married  Intimate Partner Violence: Not on file    Past Surgical History:  Procedure Laterality Date   REMOVAL OF IMPLANON  ROD  12/31/2022   TYMPANOMASTOIDECTOMY WITH RECONSTRUCTION Right 09/21/2019   Procedure: TYMPANOMASTOIDECTOMY WITH RECONSTRUCTION;  Surgeon: Janita Mellow, MD;  Location: [facility];  Service: ENT;  Laterality: Right;   WISDOM TOOTH EXTRACTION      Family History  Problem Relation Age of Onset   Gestational diabetes Mother    Hyperlipidemia Maternal Grandfather     Allergies  Allergen Reactions   Augmentin  [Amoxicillin -Pot Clavulanate] Hives   Shrimp [Shellfish Allergy]     Current Outpatient Medications on File Prior to Visit  Medication Sig Dispense Refill   albuterol  (PROAIR  HFA) 108 (90 Base) MCG/ACT  inhaler Inhale 1-2 puffs into the lungs every 6 (six) hours as needed for wheezing or shortness of breath. 18 g 0   Chloramphenicol POWD by Does not apply route. Used in the ear per ENT.     Lactic Ac-Citric Ac-Pot Bitart (PHEXXI ) 1.8-1-0.4 % GEL Place 1 Applicatorful vaginally as needed. Up to 1 hour before intercourse (Patient not taking: Reported on 12/03/2023) 60 g 12   No current facility-administered medications on file prior to visit.    BP 122/78   Pulse 77   Temp 97.8 F (36.6 C) (Temporal)   Ht 5\' 3"  (1.6 m)   Wt 265 lb (120.2 kg)   LMP 11/21/2023   SpO2 98%   BMI 46.94 kg/m  Objective:   Physical Exam Exam conducted with a chaperone present.  Cardiovascular:     Rate and Rhythm:  Normal rate and regular rhythm.  Pulmonary:     Effort: Pulmonary effort is normal.     Breath sounds: Normal breath sounds.  Genitourinary:    Labia:        Right: No rash, tenderness or lesion.        Left: No rash, tenderness or lesion.      Vagina: Normal.     Cervix: Normal.     Uterus: Normal.      Adnexa: Right adnexa normal and left adnexa normal.  Musculoskeletal:     Cervical back: Neck supple.  Skin:    General: Skin is warm and dry.  Neurological:     Mental Status: She is alert and oriented to person, place, and time.  Psychiatric:        Mood and Affect: Mood normal.           Assessment & Plan:  Moderate persistent asthma without complication Assessment & Plan: Uncontrolled without daily maintenance therapy.  Start Symbicort 80 mcg inhaler, 1 puff BID. Rx sent to pharmacy. She will update if the Symbicort is cost prohibitive.   Continue albuterol  inhaler PRN  Orders: -     Budesonide-Formoterol Fumarate; Inhale 1 puff into the lungs 2 (two) times daily.  Dispense: 3 each; Refill: 3  Anxiety and depression Assessment & Plan: Overall controlled.  Remain off Lexapro  10 mg for now. Resume trazodone  50 mg at bedtime to see if this helps with sleep  initiation. She will update.  Continue to monitor.  Orders: -     traZODone  HCl; Take 1 tablet (50 mg total) by mouth at bedtime. For sleep  Dispense: 90 tablet; Refill: 0  Class 3 severe obesity due to excess calories with body mass index (BMI) of 45.0 to 49.9 in adult Assessment & Plan: Discussed options for treatment.  Start semaglutide [facility]) for weight loss. Start by injecting 0.25 mg into the skin once weekly for 4 weeks, then increase to 0.5 mg once weekly thereafter.   Continue regular exercise and a healthy diet.  Follow-up in 3 months.  Orders: -     Semaglutide-Weight Management; Inject 0.25 mg into the skin once a week.  Dispense: 2 mL; Refill: 0  Vaginal discharge Assessment & Plan: Wet prep, gonorrhea, chlamydia swabs collected and pending today.         Ysabel Stankovich K Thalia Turkington, NP

## 2023-12-03 NOTE — Telephone Encounter (Signed)
 Pharmacy Patient Advocate Encounter   Received notification from CoverMyMeds that prior authorization for Wegovy 0.25MG /0.5ML auto-injectors is required/requested.   Insurance verification completed.   The patient is insured through [facility] .   Per test claim: PA required; PA submitted to above mentioned insurance via CoverMyMeds Key/confirmation #/EOC [id number] Status is pending

## 2023-12-04 ENCOUNTER — Other Ambulatory Visit (HOSPITAL_COMMUNITY): Payer: Self-pay

## 2023-12-04 NOTE — Telephone Encounter (Signed)
 Pharmacy Patient Advocate Encounter  Received notification from [facility] that Prior Authorization for Wegovy 0.25MG /0.5ML auto-injectors has been APPROVED from 12/03/23 to 05/31/24. Ran test claim, Copay is $4. This test claim was processed through [facility]- copay amounts may vary at other pharmacies due to pharmacy/plan contracts, or as the patient moves through the different stages of their insurance plan.   PA #/Case ID/Reference #: [id number]

## 2023-12-04 NOTE — Telephone Encounter (Signed)
 Does the [facility] require PA? She has Medicaid so it should be covered.

## 2023-12-05 ENCOUNTER — Ambulatory Visit: Payer: Self-pay | Admitting: Primary Care

## 2023-12-05 ENCOUNTER — Other Ambulatory Visit (HOSPITAL_COMMUNITY): Payer: Self-pay

## 2023-12-05 LAB — C. TRACHOMATIS/N. GONORRHOEAE RNA
C. trachomatis RNA, TMA: NOT DETECTED
N. gonorrhoeae RNA, TMA: NOT DETECTED

## 2023-12-05 LAB — WET PREP BY MOLECULAR PROBE
Candida species: NOT DETECTED
Gardnerella vaginalis: NOT DETECTED
MICRO NUMBER:: 16448903
SPECIMEN QUALITY:: ADEQUATE
Trichomonas vaginosis: NOT DETECTED

## 2023-12-27 MED ORDER — SEMAGLUTIDE-WEIGHT MANAGEMENT 0.5 MG/0.5ML ~~LOC~~ SOAJ: 0.5000 mg | SUBCUTANEOUS | 0 refills | Status: DC

## 2024-01-25 ENCOUNTER — Inpatient Hospital Stay
Admission: RE | Admit: 2024-01-25 | Discharge: 2024-01-25 | Discharge status: Home / Self Care | Source: Ambulatory Visit

## 2024-01-25 VITALS — BP 100/73 | HR 90 | Temp 98.2°F | Resp 18

## 2024-01-25 DIAGNOSIS — H60391 Other infective otitis externa, right ear: Secondary | ICD-10-CM

## 2024-01-25 MED ORDER — CEFDINIR 300 MG PO CAPS: 300.0000 mg | ORAL_CAPSULE | Freq: Two times a day (BID) | ORAL | 0 refills | Status: AC

## 2024-01-25 MED ORDER — IBUPROFEN 600 MG PO TABS: 600.0000 mg | ORAL_TABLET | Freq: Four times a day (QID) | ORAL | 0 refills | Status: AC | PRN

## 2024-01-25 NOTE — ED Provider Notes (Signed)
 [facility] [place]  Note:  This document was prepared using Conservation officer, historic buildings and may include unintentional dictation errors.  MRN: [id number] DOB: 04-Jun-1991  Subjective:   Michelle [name] is a 33 y.o. female presenting for right ear pain x 5 days, mild cough, nasal drainage, mild sinus pressure x 4 to 5 days.  Patient reports taking Tylenol  this morning and Tylenol  sinus and flu with minimal improvement.  Patient rates right ear pain 7/10 and states that she has history of right ear cholesteatoma which was resected by ENT.  The patient has been using prescribed powder and ear as directed by ENT patient reports that she usually wears earplugs when swimming but last week she went swimming and did not have any earplugs and is concern for possible swimmer's ear.  Patient reports that she cannot use eardrops due to previous tumor resection as advised by ENT.  No current facility-administered medications for this encounter.  Current Outpatient Medications:    cefdinir  (OMNICEF ) 300 MG capsule, Take 1 capsule (300 mg total) by mouth 2 (two) times daily for 7 days., Disp: 14 capsule, Rfl: 0   ibuprofen  (ADVIL ) 600 MG tablet, Take 1 tablet (600 mg total) by mouth every 6 (six) hours as needed., Disp: 30 tablet, Rfl: 0   albuterol  (PROAIR  HFA) 108 (90 Base) MCG/ACT inhaler, Inhale 1-2 puffs into the lungs every 6 (six) hours as needed for wheezing or shortness of breath., Disp: 18 g, Rfl: 0   budesonide -formoterol  (SYMBICORT ) 80-4.5 MCG/ACT inhaler, Inhale 1 puff into the lungs 2 (two) times daily., Disp: 3 each, Rfl: 3   Chloramphenicol POWD, by Does not apply route. Used in the ear per ENT., Disp: , Rfl:    Lactic Ac-Citric Ac-Pot Bitart (PHEXXI ) 1.8-1-0.4 % GEL, Place 1 Applicatorful vaginally as needed. Up to 1 hour before intercourse (Patient not taking: Reported on 12/03/2023), Disp: 60 g, Rfl: 12   Semaglutide -Weight Management 0.5 MG/0.5ML SOAJ, Inject 0.5 mg into the skin  once a week., Disp: 2 mL, Rfl: 0   traZODone  (DESYREL ) 50 MG tablet, Take 1 tablet (50 mg total) by mouth at bedtime. For sleep, Disp: 90 tablet, Rfl: 0   Allergies  Allergen Reactions   Augmentin  [Amoxicillin -Pot Clavulanate] Hives   Shrimp [Shellfish Allergy]     Past Medical History:  Diagnosis Date   Asthma    Bilateral lower abdominal cramping 11/21/2020   Bipolar disorder (manic depression) (HCC)    Dysmenorrhea    Elevated testosterone level in female    ETD (eustachian tube dysfunction) 02/08/2021   Hypertriglyceridemia    Insulin resistance    Migraines    Ruptured tympanic membrane 03/12/2016     Past Surgical History:  Procedure Laterality Date   REMOVAL OF IMPLANON  ROD  12/31/2022   TYMPANOMASTOIDECTOMY WITH RECONSTRUCTION Right 09/21/2019   Procedure: TYMPANOMASTOIDECTOMY WITH RECONSTRUCTION;  Surgeon: Jesus Oliphant, MD;  Location:  [facility];  Service: ENT;  Laterality: Right;   WISDOM TOOTH EXTRACTION      Family History  Problem Relation Age of Onset   Gestational diabetes Mother    Hyperlipidemia Maternal Grandfather     Social History   Tobacco Use   Smoking status: Every Day    Types: E-cigarettes   Smokeless tobacco: Never  Vaping Use   Vaping status: Every Day  Substance Use Topics   Alcohol use: No   Drug use: No    ROS Refer to HPI for ROS details.  Objective:   Vitals: BP  100/73 (BP Location: Left Arm)   Pulse 90   Temp 98.2 F (36.8 C)   Resp 18   LMP 01/20/2024 (Exact Date)   SpO2 98%   Physical Exam Vitals and nursing note reviewed.  Constitutional:      General: She is not in acute distress.    Appearance: She is well-developed. She is not ill-appearing or toxic-appearing.  HENT:     Head: Normocephalic and atraumatic.     Right Ear: Swelling and tenderness present. No drainage. No middle ear effusion. Tympanic membrane is not injected, erythematous or bulging.     Left Ear:  No middle ear effusion.  Tympanic membrane is not injected, erythematous or bulging.     Nose: Nasal tenderness and congestion present. No mucosal edema or rhinorrhea.     Right Sinus: Maxillary sinus tenderness present. No frontal sinus tenderness.     Left Sinus: Maxillary sinus tenderness present. No frontal sinus tenderness.     Mouth/Throat:     Mouth: Mucous membranes are moist.  Cardiovascular:     Rate and Rhythm: Normal rate.  Pulmonary:     Effort: Pulmonary effort is normal. No respiratory distress.  Musculoskeletal:        General: Normal range of motion.  Skin:    General: Skin is warm and dry.  Neurological:     General: No focal deficit present.     Mental Status: She is alert and oriented to person, place, and time.  Psychiatric:        Mood and Affect: Mood normal.        Behavior: Behavior normal.     Procedures  No results found for this or any previous visit (from the past 24 hours).  No results found.   Assessment and Plan :     Discharge Instructions       1. Infective otitis externa of right ear (Primary) - cefdinir  (OMNICEF ) 300 MG capsule; Take 1 capsule (300 mg total) by mouth 2 (two) times daily for 7 days.  Dispense: 14 capsule; Refill: 0 - ibuprofen  (ADVIL ) 600 MG tablet; Take 1 tablet (600 mg total) by mouth every 6 (six) hours as needed.  Dispense: 30 tablet; Refill: 0 -Continue to monitor symptoms for any change in severity if there is any escalation of current symptoms or development of new symptoms follow-up in ER for further evaluation and management.      Markcus Lazenby B Aalliyah Kilker   Bayne Fosnaugh, [place] B, [place] 01/25/24 [phone]

## 2024-01-25 NOTE — Discharge Instructions (Addendum)
  1. Infective otitis externa of right ear (Primary) - cefdinir  (OMNICEF ) 300 MG capsule; Take 1 capsule (300 mg total) by mouth 2 (two) times daily for 7 days.  Dispense: 14 capsule; Refill: 0 - ibuprofen  (ADVIL ) 600 MG tablet; Take 1 tablet (600 mg total) by mouth every 6 (six) hours as needed.  Dispense: 30 tablet; Refill: 0 -Continue to monitor symptoms for any change in severity if there is any escalation of current symptoms or development of new symptoms follow-up in ER for further evaluation and management.

## 2024-01-25 NOTE — ED Triage Notes (Signed)
 Patient complains of right ear pain x 5 days. Cough with nasal drainage. Sinus pressure in cheek area. Took Tylenol  at 6:30 pm last night. Also has been taking OTC Tylenol  sinus /Flu. Rates ear pain 7/10.

## 2024-01-27 DIAGNOSIS — Z6841 Body Mass Index (BMI) 40.0 and over, adult: Secondary | ICD-10-CM

## 2024-01-27 MED ORDER — SEMAGLUTIDE-WEIGHT MANAGEMENT 0.5 MG/0.5ML ~~LOC~~ SOAJ
0.5000 mg | SUBCUTANEOUS | 0 refills | Status: DC
Start: 2024-01-27 — End: 2024-03-06

## 2024-01-28 ENCOUNTER — Ambulatory Visit: Admitting: Primary Care

## 2024-02-05 ENCOUNTER — Ambulatory Visit: Admitting: Certified Nurse Midwife

## 2024-02-05 VITALS — BP 130/82 | HR 94 | Wt 261.0 lb

## 2024-02-05 DIAGNOSIS — Z30017 Encounter for initial prescription of implantable subdermal contraceptive: Secondary | ICD-10-CM | POA: Diagnosis not present

## 2024-02-05 DIAGNOSIS — Z113 Encounter for screening for infections with a predominantly sexual mode of transmission: Secondary | ICD-10-CM

## 2024-02-05 DIAGNOSIS — Z3009 Encounter for other general counseling and advice on contraception: Secondary | ICD-10-CM | POA: Diagnosis not present

## 2024-02-05 DIAGNOSIS — Z3202 Encounter for pregnancy test, result negative: Secondary | ICD-10-CM | POA: Diagnosis not present

## 2024-02-05 LAB — POCT URINE PREGNANCY: Preg Test, Ur: NEGATIVE

## 2024-02-05 MED ORDER — ETONOGESTREL 68 MG ~~LOC~~ IMPL
68.0000 mg | DRUG_IMPLANT | Freq: Once | SUBCUTANEOUS | Status: AC
  Administered 2024-02-05: 68 mg via SUBCUTANEOUS

## 2024-02-05 NOTE — Progress Notes (Signed)
 Pt is interested in BTL, would like other form of BC until that can be scheduled.  Made aware she will need an appt with a surgical provider as well. Pt has tried Phexxi  in the past and did not like.

## 2024-02-08 NOTE — Progress Notes (Signed)
 GYNECOLOGY CLINIC PROCEDURE NOTE  Ms. Michelle [name] is a 33 y.o. G2P1 here for Nexplanon  insertion as a bridge to IUD in 3 years. Periods regular 21-25 cycles. Reports heavy bleeding and painful. Usually relieved with PO ibuprofen .Tampons as her collection method going through 1-2  supers per day.  No GYN concerns.  Last pap smear was on 12/20/22 and was normal.  No other gynecologic concerns.  Nexplanon  Insertion Procedure Patient was given informed consent, she signed consent form.  Patient does understand that irregular bleeding is a very common side effect of this medication. She was advised to have backup contraception for one week after placement. Pregnancy test in clinic today was negative.  Appropriate time out taken.  Patient's left arm was prepped and draped in the usual sterile fashion.  Patient was prepped with alcohol swab and then injected with 3 ml of 1% lidocaine .  She was prepped with chlorhexidine, Nexplanon  removed from packaging,  Device confirmed in needle, then inserted full length of needle and withdrawn per handbook instructions. Nexplanon  was able to palpated in the patient's arm; patient palpated the insert herself. There was minimal blood loss.  Patient insertion site covered with guaze and a pressure bandage to reduce any bruising.  The patient tolerated the procedure well and was given post procedure instructions.    Exp: 02-27 Lot: [id number]  Emilio Delilah CHRISTELLA, CNM 02/08/2024 11:04 AM

## 2024-02-19 ENCOUNTER — Encounter: Payer: Self-pay | Admitting: Certified Nurse Midwife

## 2024-02-19 DIAGNOSIS — B999 Unspecified infectious disease: Secondary | ICD-10-CM

## 2024-02-20 ENCOUNTER — Other Ambulatory Visit: Payer: Self-pay | Admitting: Certified Nurse Midwife

## 2024-02-20 MED ORDER — SULFAMETHOXAZOLE-TRIMETHOPRIM 800-160 MG PO TABS: 2.0000 | ORAL_TABLET | Freq: Two times a day (BID) | ORAL | 0 refills | Status: DC

## 2024-02-20 NOTE — Progress Notes (Signed)
 My Chart Encounter: My Chart message received that patient is having some post-procedure complications regarding her Nexplanon .   Patient reports I was seen on 7/16 to speak about birth control. I did have Nexplanon  inserted this day. I am contacting you because I still have some bruising, redness, swelling, odd coloring, and the site is still pretty tender. Probably a 7 on a 1 to 10 scale. I also noticed when I woke up this morning that I had a popped blister around the area. Some of this may be normal but I've never experienced it with Nexplanon  before. I do not know if I need to come into the office or if you could tell me by photo if it seems normal and maybe I need to give it a little more time to heal. No fever. No rash. Just still pretty uncomfortable/painful and has me a little concerned. All photos attached were taken today 7/30. Please let me know what I need to do. Thank you.           CNM reviewed images and recommended that patient been seen in office to possibly have device removed with a short course of antibiotics. Consulted with Dr. Ozan and Dr. Eldonna on plan of care. Both MDs agreeable to plan and recommend 7 day course of Bactrim  with topical OTC Hibiclens daily. Patient has appt scheduled for 03/11/24 with CANDIE Cedar CNM for possible removal. Infection precautions reviewed. Antibiotics sent to outpatient pharmacy for pick up.   Kirbie Stodghill Erven) Cedar, MSN, CNM  [facility]  02/20/2024 11:37 AM

## 2024-02-26 ENCOUNTER — Ambulatory Visit: Admitting: Primary Care

## 2024-02-26 VITALS — BP 134/86 | HR 110 | Temp 98.1°F | Ht 63.0 in | Wt 255.0 lb

## 2024-02-26 DIAGNOSIS — E66813 Obesity, class 3: Secondary | ICD-10-CM

## 2024-02-26 DIAGNOSIS — L03119 Cellulitis of unspecified part of limb: Secondary | ICD-10-CM | POA: Diagnosis not present

## 2024-02-26 DIAGNOSIS — Z6841 Body Mass Index (BMI) 40.0 and over, adult: Secondary | ICD-10-CM

## 2024-02-26 DIAGNOSIS — L02419 Cutaneous abscess of limb, unspecified: Secondary | ICD-10-CM | POA: Diagnosis not present

## 2024-02-26 MED ORDER — SULFAMETHOXAZOLE-TRIMETHOPRIM 800-160 MG PO TABS: 2.0000 | ORAL_TABLET | Freq: Two times a day (BID) | ORAL | 0 refills | Status: AC

## 2024-02-26 NOTE — Assessment & Plan Note (Signed)
 Improving.  The last 3 weeks of her poor dietary choices are certainly slowing her progress.  She understands this. For now, she will continue Wegovy  at 0.5 mg weekly while she works on her diet.  We discussed to increase omeprazole to 20 mg twice daily or 40 mg daily. Stop Midol .  Follow-up in 3 months.

## 2024-02-26 NOTE — Progress Notes (Signed)
 Subjective:    Patient ID: Michelle [name], female    DOB: 1990-10-16, 33 y.o.   MRN: [id number]  HPI  Michelle [name] is a very pleasant 33 y.o. female with a history of migraines, asthma, GERD, class III obesity, anxiety depression who presents today for follow-up of obesity and to discuss skin wound.  1) Class 3 Obesity: She is currently managed on Wegovy  0.5 mg weekly which was initiated 3 months ago at 0.25 mg weekly.  She is here for follow-up today.  Since her last visit she has lost 10 pounds according to our scales and 9 pounds on her scales. Over the last few weeks she's not been the best with her diet. She's also under a lot of personal stress. She did experience painful upper abdominal bloating with sour burps. She's been taking Midol  daily for 2 weeks and symptoms have resolved. Antiacids were not helping.  She was taking Zantac and omeprazole 20 mg at different times.  She's been increasing intake of protein. She is snacking on Nutrigrain bars, boiled eggs. She is eating one full meal during the day with one snack during the day. She is mostly drinking water. Over the last 3 weeks she's eaten more junk food and take out due to personal stress.    Wt Readings from Last 3 Encounters:  02/26/24 255 lb (115.7 kg)  02/05/24 261 lb (118.4 kg)  12/03/23 265 lb (120.2 kg)   Body mass index is 45.17 kg/m.   2) Wound: Located to the left medial humeral arm for which began around 02/15/24. She had Nexplanon  inserted per GYN to the on 02/05/24. Ten days later she developed an infection around the site.  She was prescribed a 7 day course of Bactrim  DS per OB/GYN. She has noticed improvement in her wound, but questions if she needs additional treatment.  She has 1 dose of Bactrim  remaining.  Her follow-up with GYN is not until later this month.  She did initially have a fever, but no fevers since her symptoms first began.  She brings pictures with her today.  Review of Systems   Constitutional:  Negative for fever.  Gastrointestinal:        GERD, abdominal bloating  Skin:  Positive for color change and wound.         Past Medical History:  Diagnosis Date   Asthma    Bilateral lower abdominal cramping 11/21/2020   Bipolar disorder (manic depression) (HCC)    Dysmenorrhea    Elevated testosterone level in female    ETD (eustachian tube dysfunction) 02/08/2021   Hypertriglyceridemia    Insulin resistance    Migraines    Ruptured tympanic membrane 03/12/2016    Social History   Socioeconomic History   Marital status: Single    Spouse name: Not on file   Number of children: Not on file   Years of education: Not on file   Highest education level: 12th grade  Occupational History   Not on file  Tobacco Use   Smoking status: Every Day    Types: E-cigarettes   Smokeless tobacco: Never  Vaping Use   Vaping status: Every Day  Substance and Sexual Activity   Alcohol use: No   Drug use: No   Sexual activity: Yes    Birth control/protection: Abstinence, None  Other Topics Concern   Not on file  Social History Narrative   Single.   1 child.   Works as a Emergency planning/management officer.  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    Social Drivers of Corporate investment banker Strain: Low Risk  (02/26/2024)   Overall Financial Resource Strain (CARDIA)    Difficulty of Paying Living Expenses: Not very hard  Food Insecurity: Food Insecurity Present (02/26/2024)   Hunger Vital Sign    Worried About Running Out of Food in the Last Year: Sometimes true    Ran Out of Food in the Last Year: Sometimes true  Transportation Needs: No Transportation Needs (02/26/2024)   PRAPARE - Administrator, Civil Service (Medical): No    Lack of Transportation (Non-Medical): No  Physical Activity: Insufficiently Active (02/26/2024)   Exercise Vital Sign    Days of Exercise per Week: 3 days    Minutes of Exercise per Session: 10 min  Stress: Stress Concern Present (02/26/2024)    [employer] of Occupational Health - Occupational Stress Questionnaire    Feeling of Stress: To some extent  Social Connections: Socially Isolated (02/26/2024)   Social Connection and Isolation Panel    Frequency of Communication with Friends and Family: More than three times a week    Frequency of Social Gatherings with Friends and Family: Twice a week    Attends Religious Services: Never    Database administrator or Organizations: No    Attends Engineer, structural: Not on file    Marital Status: Never married  Intimate Partner Violence: Not on file    Past Surgical History:  Procedure Laterality Date   REMOVAL OF IMPLANON  ROD  12/31/2022   TYMPANOMASTOIDECTOMY WITH RECONSTRUCTION Right 09/21/2019   Procedure: TYMPANOMASTOIDECTOMY WITH RECONSTRUCTION;  Surgeon: Jesus Oliphant, MD;  Location: [facility];  Service: ENT;  Laterality: Right;   WISDOM TOOTH EXTRACTION      Family History  Problem Relation Age of Onset   Gestational diabetes Mother    Hyperlipidemia Maternal Grandfather     Allergies  Allergen Reactions   Augmentin  [Amoxicillin -Pot Clavulanate] Hives   Shrimp [Shellfish Allergy]     Current Outpatient Medications on File Prior to Visit  Medication Sig Dispense Refill   albuterol  (PROAIR  HFA) 108 (90 Base) MCG/ACT inhaler Inhale 1-2 puffs into the lungs every 6 (six) hours as needed for wheezing or shortness of breath. 18 g 0   budesonide -formoterol  (SYMBICORT ) 80-4.5 MCG/ACT inhaler Inhale 1 puff into the lungs 2 (two) times daily. 3 each 3   Chloramphenicol POWD by Does not apply route. Used in the ear per ENT.     ibuprofen  (ADVIL ) 600 MG tablet Take 1 tablet (600 mg total) by mouth every 6 (six) hours as needed. 30 tablet 0   Semaglutide -Weight Management 0.5 MG/0.5ML SOAJ Inject 0.5 mg into the skin once a week. 2 mL 0   No current facility-administered medications on file prior to visit.    BP 134/86   Pulse (!) 110   Temp 98.1 F  (36.7 C) (Temporal)   Ht 5' 3 (1.6 m)   Wt 255 lb (115.7 kg)   LMP 02/24/2024   SpO2 100%   BMI 45.17 kg/m  Objective:   Physical Exam Cardiovascular:     Rate and Rhythm: Normal rate and regular rhythm.  Pulmonary:     Effort: Pulmonary effort is normal.     Breath sounds: Normal breath sounds.  Musculoskeletal:     Cervical back: Neck supple.  Skin:    General: Skin is warm and dry.     Comments: 2 cm X 1  cm scabbed wound to the left medial humeral part of her upper extremity.  Mild surrounding erythema.  Tender.  No drainage.  Neurological:     Mental Status: She is alert and oriented to person, place, and time.  Psychiatric:        Mood and Affect: Mood normal.           Assessment & Plan:  Class 3 severe obesity due to excess calories with body mass index (BMI) of 45.0 to 49.9 in adult Assessment & Plan: Improving.  The last 3 weeks of her poor dietary choices are certainly slowing her progress.  She understands this. For now, she will continue Wegovy  at 0.5 mg weekly while she works on her diet.  We discussed to increase omeprazole to 20 mg twice daily or 40 mg daily. Stop Midol .  Follow-up in 3 months.   Cellulitis and abscess of upper extremity Assessment & Plan: Secondary to Nexplanon  insertion.  Improving based on pictures that she has on her phone. Do agree that she needs additional 3 days of antibiotic treatment. Rx for Bactrim  DS BID x 3 days provided.  She will continue to monitor. Follow-up with OB/GYN as scheduled.    Orders: -     Sulfamethoxazole -Trimethoprim ; Take 2 tablets by mouth 2 (two) times daily for 3 days.  Dispense: 12 tablet; Refill: 0        Comer MARLA Gaskins, NP

## 2024-02-26 NOTE — Patient Instructions (Signed)
 Continue the Bactrim  antibiotic tablets for 3 additional days.  I sent a new prescription to your pharmacy.  Continue Wegovy  0.5 mg weekly for now.  Please schedule a physical to meet with me in 3 months.   It was a pleasure to see you today!

## 2024-02-26 NOTE — Assessment & Plan Note (Signed)
 Secondary to Nexplanon  insertion.  Improving based on pictures that she has on her phone. Do agree that she needs additional 3 days of antibiotic treatment. Rx for Bactrim  DS BID x 3 days provided.  She will continue to monitor. Follow-up with OB/GYN as scheduled.

## 2024-03-06 DIAGNOSIS — Z6841 Body Mass Index (BMI) 40.0 and over, adult: Secondary | ICD-10-CM

## 2024-03-06 MED ORDER — SEMAGLUTIDE-WEIGHT MANAGEMENT 0.5 MG/0.5ML ~~LOC~~ SOAJ: 0.5000 mg | SUBCUTANEOUS | 0 refills | Status: AC

## 2024-03-08 DIAGNOSIS — H60501 Unspecified acute noninfective otitis externa, right ear: Secondary | ICD-10-CM | POA: Diagnosis not present

## 2024-03-08 DIAGNOSIS — H9211 Otorrhea, right ear: Secondary | ICD-10-CM | POA: Diagnosis not present

## 2024-03-10 DIAGNOSIS — H7101 Cholesteatoma of attic, right ear: Secondary | ICD-10-CM | POA: Diagnosis not present

## 2024-03-10 DIAGNOSIS — H60331 Swimmer's ear, right ear: Secondary | ICD-10-CM | POA: Diagnosis not present

## 2024-03-11 ENCOUNTER — Ambulatory Visit (INDEPENDENT_AMBULATORY_CARE_PROVIDER_SITE_OTHER): Admitting: Certified Nurse Midwife

## 2024-03-11 ENCOUNTER — Encounter: Payer: Self-pay | Admitting: Certified Nurse Midwife

## 2024-03-11 VITALS — BP 136/76 | HR 98 | Wt 263.0 lb

## 2024-03-11 DIAGNOSIS — B999 Unspecified infectious disease: Secondary | ICD-10-CM

## 2024-03-11 DIAGNOSIS — Z3046 Encounter for surveillance of implantable subdermal contraceptive: Secondary | ICD-10-CM

## 2024-03-11 NOTE — Progress Notes (Signed)
 RGYN here for Nexplanon  removal due to site being infected.

## 2024-03-11 NOTE — Progress Notes (Signed)
 GYNECOLOGY CLINIC PROCEDURE NOTE  Ms. Michelle [name] is a 33 y.o. G2P1 here for Nexplanon  removal due to infection. No GYN concerns.    Imaging from 1 week post insertion.         Oral Antibiotics ordered at that time. Completed Course. Imaging below is 1 week post Abx therapy.    Imaging below is how the site appears today. Prior to removal.        Imaging below is post removal   Nexplanon  Removal Patient was given informed consent for removal of her Nexplanon .  Appropriate time out taken. Nexplanon  site identified.  Area prepped in usual sterile fashon. One ml of 1% lidocaine  was used to anesthetize the area at the distal end of the implant. A small stab incision was made right beside the implant on the distal portion. Due to infection, the tissue around the device had become scarred and thickened. A 2nd stab incision was made in the same place and with the help of the assisting RN holding in place the device was grasped and then easily slid out. The Nexplanon  rod was grasped using hemostats and removed. There was minimal blood loss. Due to infection, site cleaned, dried and pressure gauze applied. Recommend to Continue to use Hibiclens for the next day and return to the office in 2 days to assess healing. The patient tolerated the procedure well and was given post procedure instructions.  Patient is planning to get a Tubal Ligation for contraception/attempt conception.   Emilio Delilah CHRISTELLA, CNM 03/11/2024 9:20 AM

## 2024-03-13 ENCOUNTER — Ambulatory Visit

## 2024-03-13 DIAGNOSIS — Z5189 Encounter for other specified aftercare: Secondary | ICD-10-CM | POA: Diagnosis not present

## 2024-03-13 NOTE — Progress Notes (Signed)
 Subjective:     Michelle [name] is a 33 y.o. female who presents to the clinic  weeks status post nexplanon  removal 2 days ago.  Pt states she has had some light green discharge on her bandage.     Objective:    LMP 02/24/2024  General:  alert, well appearing, in no apparent distress  Incision:   healing well, no erythema, no seroma, no swelling     Assessment:    Area is healing well. No bruising or redness or swelling noted.    Plan:    1.Wound care discussed. 2. Follow up: as needed and will have patient send me a picture of the area via MyChart on Monday. SABRA Wanda Buckles, RN

## 2024-03-18 LAB — WOUND CULTURE

## 2024-05-20 ENCOUNTER — Telehealth: Payer: Self-pay

## 2024-05-20 ENCOUNTER — Other Ambulatory Visit (HOSPITAL_COMMUNITY): Payer: Self-pay

## 2024-05-20 NOTE — Telephone Encounter (Signed)
 [name], Michelle M - [place]  Pharmacy Patient Advocate Encounter   Received notification from Onbase that prior authorization for Wegovy  0.25 is required/requested.   Insurance verification completed.   The patient is insured through [facility].   Per test claim: Effective October 1st, Medicaid will discontinue coverage of GLP1 medications for weight loss (such as Wegovy  and Zepbound), unless the patient has a documented history of a heart attack or stroke. Zepbound will continue to be covered only for patients with moderate to severe sleep apnea (AHI 15-30) and a BMI greater than 40. Because of this change, the prior authorization team will not be submitting new PA requests for GLP1 medications prescribed for weight loss, as patients will be unable to continue therapy under Medicaid coverage.  No evidence of cardiac issues in chart notes

## 2024-05-23 NOTE — Telephone Encounter (Signed)
 Noted

## 2024-05-26 ENCOUNTER — Other Ambulatory Visit (HOSPITAL_COMMUNITY): Payer: Self-pay

## 2024-05-27 ENCOUNTER — Other Ambulatory Visit (HOSPITAL_COMMUNITY): Payer: Self-pay

## 2024-05-28 ENCOUNTER — Ambulatory Visit: Admitting: Primary Care

## 2024-05-29 ENCOUNTER — Other Ambulatory Visit: Payer: Self-pay | Admitting: Primary Care

## 2024-05-29 DIAGNOSIS — J4540 Moderate persistent asthma, uncomplicated: Secondary | ICD-10-CM
